# Patient Record
Sex: Male | Born: 1935 | Race: White | Hispanic: No | Marital: Married | Smoking: Former smoker
Health system: Southern US, Community
[De-identification: ages and names within clinical notes are randomized; demographics above are authoritative.]

## PROBLEM LIST (undated history)

## (undated) DIAGNOSIS — K219 Gastro-esophageal reflux disease without esophagitis: Secondary | ICD-10-CM

## (undated) DIAGNOSIS — C4490 Unspecified malignant neoplasm of skin, unspecified: Secondary | ICD-10-CM

## (undated) DIAGNOSIS — R918 Other nonspecific abnormal finding of lung field: Secondary | ICD-10-CM

## (undated) DIAGNOSIS — E039 Hypothyroidism, unspecified: Secondary | ICD-10-CM

## (undated) DIAGNOSIS — C719 Malignant neoplasm of brain, unspecified: Secondary | ICD-10-CM

## (undated) DIAGNOSIS — N400 Enlarged prostate without lower urinary tract symptoms: Secondary | ICD-10-CM

## (undated) DIAGNOSIS — C3490 Malignant neoplasm of unspecified part of unspecified bronchus or lung: Secondary | ICD-10-CM

## (undated) HISTORY — PX: INGUINAL HERNIA REPAIR: SUR1180

## (undated) HISTORY — DX: Benign prostatic hyperplasia without lower urinary tract symptoms: N40.0

## (undated) HISTORY — PX: BRONCHOSCOPY: SUR163

## (undated) HISTORY — DX: Other nonspecific abnormal finding of lung field: R91.8

## (undated) HISTORY — DX: Hypothyroidism, unspecified: E03.9

---

## 1998-03-07 HISTORY — PX: SPINE SURGERY: SHX786

## 1998-06-22 ENCOUNTER — Ambulatory Visit (HOSPITAL_COMMUNITY): Admission: RE | Admit: 1998-06-22 | Discharge: 1998-06-22 | Payer: Self-pay | Admitting: *Deleted

## 1998-06-22 ENCOUNTER — Encounter: Payer: Self-pay | Admitting: *Deleted

## 1998-07-22 ENCOUNTER — Ambulatory Visit (HOSPITAL_COMMUNITY): Admission: RE | Admit: 1998-07-22 | Discharge: 1998-07-23 | Payer: Self-pay | Admitting: Neurosurgery

## 1998-07-22 ENCOUNTER — Encounter: Payer: Self-pay | Admitting: Neurosurgery

## 2012-12-14 LAB — PULMONARY FUNCTION TEST

## 2013-01-07 DIAGNOSIS — R918 Other nonspecific abnormal finding of lung field: Secondary | ICD-10-CM | POA: Insufficient documentation

## 2013-01-08 ENCOUNTER — Ambulatory Visit
Admission: RE | Admit: 2013-01-08 | Discharge: 2013-01-08 | Disposition: A | Discharge status: Home / Self Care | Payer: Medicare Other | Source: Ambulatory Visit | Attending: Thoracic Surgery (Cardiothoracic Vascular Surgery) | Admitting: Thoracic Surgery (Cardiothoracic Vascular Surgery)

## 2013-01-08 ENCOUNTER — Other Ambulatory Visit: Payer: Self-pay

## 2013-01-08 ENCOUNTER — Encounter: Payer: Self-pay | Admitting: Thoracic Surgery (Cardiothoracic Vascular Surgery)

## 2013-01-08 ENCOUNTER — Institutional Professional Consult (permissible substitution) (INDEPENDENT_AMBULATORY_CARE_PROVIDER_SITE_OTHER): Payer: Medicare Other | Admitting: Thoracic Surgery (Cardiothoracic Vascular Surgery)

## 2013-01-08 ENCOUNTER — Other Ambulatory Visit: Payer: Self-pay | Admitting: *Deleted

## 2013-01-08 VITALS — BP 146/60 | HR 91 | Resp 16 | Ht 72.0 in | Wt 175.0 lb

## 2013-01-08 DIAGNOSIS — R918 Other nonspecific abnormal finding of lung field: Secondary | ICD-10-CM

## 2013-01-08 DIAGNOSIS — R911 Solitary pulmonary nodule: Secondary | ICD-10-CM

## 2013-01-08 DIAGNOSIS — D381 Neoplasm of uncertain behavior of trachea, bronchus and lung: Secondary | ICD-10-CM

## 2013-01-08 DIAGNOSIS — R222 Localized swelling, mass and lump, trunk: Secondary | ICD-10-CM

## 2013-01-08 DIAGNOSIS — J984 Other disorders of lung: Secondary | ICD-10-CM

## 2013-01-08 NOTE — Progress Notes (Signed)
PCP is No primary provider on file. Referring Provider is Chodri, Tanvir, MD  Chief Complaint  Patient presents with  . Lung Mass    left lower lobe...eval and treat...has had bronchoscpy with negative bxs, CT CHEST, PET , AND PFT'S  . Lung Lesion    right lung    HPI: 79-year-old man sent for consultation regarding bilateral lung masses.  Darrell Guerrero is an 79-year-old gentleman with a remote history of tobacco abuse. He started smoking at age 7. He smoked up to 2 packs a day before quitting in 1963 at age 35. He was in his usual state of health until about a month ago when he developed a fever. His wife says his blood pressure also was low from time to time, and he generally didn't feel well. He eventually went to his doctor and a chest x-ray showed an abnormality. He was started on antibiotics and a CT of the chest was done. It showed a large mass in the superior segment of the left lower lobe that extended into the hilum with hilar adenopathy. There also was a smaller spiculated nodule in the right upper lobe. A PET CT was done which showed these lesions were hypermetabolic. He was referred to Dr. Chodri and had bronchoscopy with biopsy. Pathology showed reactive cells but no definite tumor.  He denies any unusual headaches or visual changes. He has arthritis which is and standing and has not changed recently. He says that his appetite is normal and he has not lost weight. He gets a little short of breath with heavy exertion. He has not had any chest pain. He does say he has a sensation of just not feeling right in his chest.   Past Medical History  Diagnosis Date  . Hypothyroidism   . BPH (benign prostatic hypertrophy)   . Lung mass     Past Surgical History  Procedure Laterality Date  . Bronchoscopy    . Spine surgery  2000    [facility]    Family History  Problem Relation Age of Onset  . Prostate cancer Father   . Dementia Mother     Social History History  Substance Use Topics   . Smoking status: Former Smoker -- 2.00 packs/day for 28 years    Types: Cigarettes    Quit date: 01/08/1962  . Smokeless tobacco: Never Used     Comment: stopped smoking at age 35  . Alcohol Use: No    Current Outpatient Prescriptions  Medication Sig Dispense Refill  . levothyroxine (SYNTHROID, LEVOTHROID) 25 MCG tablet Take 25 mcg by mouth daily before breakfast.      . tamsulosin (FLOMAX) 0.4 MG CAPS capsule Take 0.4 mg by mouth daily.       No current facility-administered medications for this visit.    No Known Allergies  Review of Systems  Constitutional: Positive for fever. Negative for appetite change, fatigue and unexpected weight change.  HENT: Positive for hearing loss.   Respiratory: Positive for cough and shortness of breath (With heavy exertion). Negative for chest tightness.   Cardiovascular: Negative for chest pain.  Musculoskeletal: Positive for arthralgias and joint swelling.       Leg cramps    BP 146/60  Pulse 91  Resp 16  Ht 6' (1.829 m)  Wt 175 lb (79.379 kg)  BMI 23.73 kg/m2  SpO2 98% Physical Exam  Vitals reviewed. Constitutional: He is oriented to person, place, and time. He appears well-developed and well-nourished. No distress.  Very hard   of hearing  HENT:  Head: Normocephalic and atraumatic.  Eyes: EOM are normal. Pupils are equal, round, and reactive to light.  Neck: Neck supple. No thyromegaly present.  Cardiovascular: Normal rate, regular rhythm, normal heart sounds and intact distal pulses.  Exam reveals no gallop and no friction rub.   No murmur heard. Pulmonary/Chest:  Bronchial BS posteriorly and superiorly on left  Abdominal: Soft. There is no tenderness.  Musculoskeletal: He exhibits no edema.  Lymphadenopathy:    He has no cervical adenopathy.  Neurological: He is alert and oriented to person, place, and time. No cranial nerve deficit.  No focal motor deficit  Skin: Skin is warm and dry.     Diagnostic Tests: CT chest  12/06/2012 Findings Left posterior suprahilar soft tissue mass adjacent to the mediastinum measuring 48 x 40 x 58 mm. This is contiguous with a left suprahilar lymphadenopathy measuring up to 14 mm short axis. The epicenter of the masses in the superior segment of the left lower lobe, extending to the major fissure. This mass does the appear to nearly occluded the superior segmental bronchus.  No other left lung mass. The lower lobe atelectasis.  On the right side there is abnormal density in the right upper lobe posteriorly which most resembles a cluster small nodules but includes a larger mildly spiculated central lesion measuring up to 18 mm. There is a small 3 cm nodule in the mid right lung on image 30. There is a 5 mm nodule in the right lower lobe on image 38. Superimposed and atelectasis.  No pericardial or pleural effusion. No right hilar, paratracheal, or anterior carinal lymphadenopathy. There is borderline small prevascular noted the anterior mediastinum measuring 8 mm. There is a mild surrounding residual thymic tissue. No axillary lymphadenopathy. Negative for major vascular structures of the chest at this time. There is aortic coronary atherosclerosis.  PET/CT 12/19/2012 Impression 1. Mass in the superior segment of left lower lobe is intensely hypermetabolic and concerning for primary lung neoplasm. There is direct extension to the left hilar region.  2. Within the contralateral right upper lobe there is 1.2 cm nodule which exhibits malignant range FDG uptake. This may represent a focus of metastatic disease or synchronous primary.  3. No evidence of distant metastatic disease  Impression: 79-year-old gentleman with a remote history of tobacco abuse who has bilateral lung masses. There is a large mass in the superior segment of the left lower lobe with hilar adenopathy. This is in continuity with the mediastinum and extends into the hilum association with the adenopathy. This almost  certainly represents a new primary bronchogenic carcinoma. An attempt was made to biopsy this bronchoscopically but due to the angulation of the bronchus a diagnosis could not be made. This lesion would at least require a pneumonectomy but may be unresectable due to direct invasion of the mediastinum. I would not do a pneumonectomy in this gentleman given his age and the right upper lobe mass which is suspicious for a second primary. We do need to establish a tissue diagnosis so that appropriate treatment can be given. Both of these lesions should be approachable with electromagnetic navigational bronchoscopy. In addition, we should also be able to sample the hilar nodes and any suspicious mediastinal nodes with interbronchial ultrasound at the same setting.  I recommended to Mr. and [name] that we proceed with electromagnetic navigational bronchoscopy and endobronchial ultrasound under general anesthesia. I described the procedure to them in detail. They understand this is a diagnostic   and therapeutic procedure.. I discussed the outpatient nature of the procedure. They do understand that there is no guarantee that a diagnosis will be made, but this does give us the best chance to sample both lung masses as well as the lymph nodes.  I discussed the indications, risks, benefits, and alternatives. They understand the risk include, but are not limited to, those associated with general anesthesia, bleeding, pneumothorax, failure to make a diagnosis , as well as the possibility of unforeseeable complications. He understands and accepts these risks and wishes to proceed.  Plan: ENB and EBUS on Wednesday, November 12 

## 2013-01-09 ENCOUNTER — Encounter (HOSPITAL_COMMUNITY): Payer: Self-pay | Admitting: Pharmacy Technician

## 2013-01-14 ENCOUNTER — Ambulatory Visit (HOSPITAL_COMMUNITY)
Admission: RE | Admit: 2013-01-14 | Discharge: 2013-01-14 | Disposition: A | Discharge status: Home / Self Care | Payer: Medicare Other | Source: Ambulatory Visit | Attending: Thoracic Surgery (Cardiothoracic Vascular Surgery) | Admitting: Thoracic Surgery (Cardiothoracic Vascular Surgery)

## 2013-01-14 ENCOUNTER — Encounter (HOSPITAL_COMMUNITY): Payer: Self-pay

## 2013-01-14 VITALS — BP 162/87 | HR 69 | Temp 97.7°F | Resp 18 | Ht 72.0 in | Wt 175.6 lb

## 2013-01-14 DIAGNOSIS — Z01812 Encounter for preprocedural laboratory examination: Secondary | ICD-10-CM | POA: Insufficient documentation

## 2013-01-14 DIAGNOSIS — R918 Other nonspecific abnormal finding of lung field: Secondary | ICD-10-CM

## 2013-01-14 DIAGNOSIS — R911 Solitary pulmonary nodule: Secondary | ICD-10-CM

## 2013-01-14 DIAGNOSIS — Z01818 Encounter for other preprocedural examination: Secondary | ICD-10-CM | POA: Insufficient documentation

## 2013-01-14 DIAGNOSIS — Z01810 Encounter for preprocedural cardiovascular examination: Secondary | ICD-10-CM | POA: Insufficient documentation

## 2013-01-14 HISTORY — DX: Unspecified malignant neoplasm of skin, unspecified: C44.90

## 2013-01-14 HISTORY — DX: Gastro-esophageal reflux disease without esophagitis: K21.9

## 2013-01-14 LAB — CBC
MCHC: 34.3 g/dL (ref 30.0–36.0)
MCV: 84.6 fL (ref 78.0–100.0)
Platelets: 285 10*3/uL (ref 150–400)
RDW: 14.7 % (ref 11.5–15.5)
WBC: 8.7 10*3/uL (ref 4.0–10.5)

## 2013-01-14 LAB — COMPREHENSIVE METABOLIC PANEL
ALT: 14 U/L (ref 0–53)
AST: 23 U/L (ref 0–37)
Albumin: 3.9 g/dL (ref 3.5–5.2)
Chloride: 102 mEq/L (ref 96–112)
Creatinine, Ser: 1.15 mg/dL (ref 0.50–1.35)
Glucose, Bld: 94 mg/dL (ref 70–99)
Total Bilirubin: 0.8 mg/dL (ref 0.3–1.2)

## 2013-01-14 LAB — PROTIME-INR: INR: 1.05 (ref 0.00–1.49)

## 2013-01-14 LAB — APTT: aPTT: 29 seconds (ref 24–37)

## 2013-01-14 NOTE — Pre-Procedure Instructions (Signed)
Darrell Guerrero  01/14/2013   Your procedure is scheduled on:  November 12  Report to [facility] Entrance "A" [address] at [employer] AM.  Call this number if you have problems the morning of surgery: [phone]   Remember:   Do not eat food or drink liquids after midnight.   Take these medicines the morning of surgery with A SIP OF WATER: Levothyroxine, Prilosec, Flomax   Do not wear jewelry, make-up or nail polish.  Do not wear lotions, powders, or perfumes. You may wear deodorant.  Do not shave 48 hours prior to surgery. Men may shave face and neck.  Do not bring valuables to the hospital.  [facility] is not responsible                  for any belongings or valuables.               Contacts, dentures or bridgework may not be worn into surgery.  Leave suitcase in the car. After surgery it may be brought to your room.  For patients admitted to the hospital, discharge time is determined by your                treatment team.               Patients discharged the day of surgery will not be allowed to drive  home.  Name and phone number of your driver: Family/ Friend  Special Instructions: Shower using CHG 2 nights before surgery and the night before surgery.  If you shower the day of surgery use CHG.  Use special wash - you have one bottle of CHG for all showers.  You should use approximately 1/3 of the bottle for each shower.   Please read over the following fact sheets that you were given: Pain Booklet, Coughing and Deep Breathing and Surgical Site Infection Prevention

## 2013-01-16 ENCOUNTER — Encounter (HOSPITAL_COMMUNITY): Payer: Self-pay | Admitting: Certified Registered"

## 2013-01-16 ENCOUNTER — Encounter (HOSPITAL_COMMUNITY): Payer: Medicare Other | Admitting: Certified Registered"

## 2013-01-16 ENCOUNTER — Ambulatory Visit (HOSPITAL_COMMUNITY)
Admission: RE | Admit: 2013-01-16 | Discharge: 2013-01-16 | Disposition: A | Discharge status: Home / Self Care | Payer: Medicare Other | Source: Ambulatory Visit | Attending: Thoracic Surgery (Cardiothoracic Vascular Surgery) | Admitting: Thoracic Surgery (Cardiothoracic Vascular Surgery)

## 2013-01-16 ENCOUNTER — Encounter (HOSPITAL_COMMUNITY)
Admission: RE | Disposition: A | Discharge status: Home / Self Care | Payer: Self-pay | Source: Ambulatory Visit | Attending: Thoracic Surgery (Cardiothoracic Vascular Surgery)

## 2013-01-16 ENCOUNTER — Ambulatory Visit (HOSPITAL_COMMUNITY): Payer: Medicare Other

## 2013-01-16 ENCOUNTER — Ambulatory Visit (HOSPITAL_COMMUNITY): Payer: Medicare Other | Admitting: Certified Registered"

## 2013-01-16 DIAGNOSIS — Z87891 Personal history of nicotine dependence: Secondary | ICD-10-CM | POA: Insufficient documentation

## 2013-01-16 DIAGNOSIS — R918 Other nonspecific abnormal finding of lung field: Secondary | ICD-10-CM

## 2013-01-16 DIAGNOSIS — R911 Solitary pulmonary nodule: Secondary | ICD-10-CM

## 2013-01-16 DIAGNOSIS — C3430 Malignant neoplasm of lower lobe, unspecified bronchus or lung: Secondary | ICD-10-CM | POA: Insufficient documentation

## 2013-01-16 DIAGNOSIS — N400 Enlarged prostate without lower urinary tract symptoms: Secondary | ICD-10-CM | POA: Insufficient documentation

## 2013-01-16 DIAGNOSIS — E039 Hypothyroidism, unspecified: Secondary | ICD-10-CM | POA: Insufficient documentation

## 2013-01-16 DIAGNOSIS — D381 Neoplasm of uncertain behavior of trachea, bronchus and lung: Secondary | ICD-10-CM

## 2013-01-16 DIAGNOSIS — K219 Gastro-esophageal reflux disease without esophagitis: Secondary | ICD-10-CM | POA: Insufficient documentation

## 2013-01-16 HISTORY — PX: VIDEO BRONCHOSCOPY WITH ENDOBRONCHIAL NAVIGATION: SHX6175

## 2013-01-16 HISTORY — PX: VIDEO BRONCHOSCOPY WITH ENDOBRONCHIAL ULTRASOUND: SHX6177

## 2013-01-16 SURGERY — VIDEO BRONCHOSCOPY WITH ENDOBRONCHIAL NAVIGATION
Anesthesia: General | Site: Chest | Wound class: Clean Contaminated

## 2013-01-16 MED ORDER — EPINEPHRINE HCL 1 MG/ML IJ SOLN
INTRAMUSCULAR | Status: AC
  Filled 2013-01-16: qty 1

## 2013-01-16 MED ORDER — ACETAMINOPHEN 500 MG PO TABS: 1000.0000 mg | ORAL_TABLET | Freq: Four times a day (QID) | ORAL | Status: DC

## 2013-01-16 MED ORDER — EPHEDRINE SULFATE 50 MG/ML IJ SOLN
INTRAMUSCULAR | Status: DC | PRN
  Administered 2013-01-16: 15 mg via INTRAVENOUS
  Administered 2013-01-16: 10 mg via INTRAVENOUS

## 2013-01-16 MED ORDER — ROCURONIUM BROMIDE 100 MG/10ML IV SOLN
INTRAVENOUS | Status: DC | PRN
  Administered 2013-01-16: 30 mg via INTRAVENOUS

## 2013-01-16 MED ORDER — HYDROMORPHONE HCL PF 1 MG/ML IJ SOLN: 0.2500 mg | INTRAMUSCULAR | Status: DC | PRN

## 2013-01-16 MED ORDER — SODIUM CHLORIDE 0.9 % IV SOLN: 250.0000 mL | INTRAVENOUS | Status: DC | PRN

## 2013-01-16 MED ORDER — NEOSTIGMINE METHYLSULFATE 1 MG/ML IJ SOLN
INTRAMUSCULAR | Status: DC | PRN
  Administered 2013-01-16: 3 mg via INTRAVENOUS

## 2013-01-16 MED ORDER — DEXTROSE 5 % IV SOLN
10.0000 mg | INTRAVENOUS | Status: DC | PRN
  Administered 2013-01-16: 20 ug/min via INTRAVENOUS

## 2013-01-16 MED ORDER — PROPOFOL 10 MG/ML IV BOLUS
INTRAVENOUS | Status: DC | PRN
  Administered 2013-01-16: 200 mg via INTRAVENOUS
  Administered 2013-01-16: 20 mg via INTRAVENOUS
  Administered 2013-01-16: 30 mg via INTRAVENOUS

## 2013-01-16 MED ORDER — LIDOCAINE HCL (CARDIAC) 20 MG/ML IV SOLN
INTRAVENOUS | Status: DC | PRN
  Administered 2013-01-16: 100 mg via INTRAVENOUS
  Administered 2013-01-16: 50 mg via INTRATRACHEAL

## 2013-01-16 MED ORDER — 0.9 % SODIUM CHLORIDE (POUR BTL) OPTIME
TOPICAL | Status: DC | PRN
  Administered 2013-01-16: 1000 mL

## 2013-01-16 MED ORDER — ACETAMINOPHEN 325 MG PO TABS: 650.0000 mg | ORAL_TABLET | ORAL | Status: DC | PRN

## 2013-01-16 MED ORDER — FENTANYL CITRATE 0.05 MG/ML IJ SOLN
INTRAMUSCULAR | Status: DC | PRN
  Administered 2013-01-16: 50 ug via INTRAVENOUS
  Administered 2013-01-16 (×2): 25 ug via INTRAVENOUS
  Administered 2013-01-16: 50 ug via INTRAVENOUS

## 2013-01-16 MED ORDER — LACTATED RINGERS IV SOLN
INTRAVENOUS | Status: DC
  Administered 2013-01-16 (×2): via INTRAVENOUS

## 2013-01-16 MED ORDER — ACETAMINOPHEN 650 MG RE SUPP: 650.0000 mg | RECTAL | Status: DC | PRN

## 2013-01-16 MED ORDER — SODIUM CHLORIDE 0.9 % IJ SOLN: 3.0000 mL | INTRAMUSCULAR | Status: DC | PRN

## 2013-01-16 MED ORDER — GLYCOPYRROLATE 0.2 MG/ML IJ SOLN
INTRAMUSCULAR | Status: DC | PRN
  Administered 2013-01-16: 0.4 mg via INTRAVENOUS

## 2013-01-16 MED ORDER — ONDANSETRON HCL 4 MG/2ML IJ SOLN: 4.0000 mg | Freq: Once | INTRAMUSCULAR | Status: DC | PRN

## 2013-01-16 MED ORDER — SODIUM CHLORIDE 0.9 % IJ SOLN: 3.0000 mL | Freq: Two times a day (BID) | INTRAMUSCULAR | Status: DC

## 2013-01-16 MED ORDER — ONDANSETRON HCL 4 MG/2ML IJ SOLN
INTRAMUSCULAR | Status: DC | PRN
  Administered 2013-01-16: 4 mg via INTRAVENOUS

## 2013-01-16 MED ORDER — EPINEPHRINE HCL 1 MG/ML IJ SOLN
INTRAMUSCULAR | Status: DC | PRN
  Administered 2013-01-16 (×2): 1 mg

## 2013-01-16 MED ORDER — PHENYLEPHRINE HCL 10 MG/ML IJ SOLN
INTRAMUSCULAR | Status: DC | PRN
  Administered 2013-01-16 (×2): 120 ug via INTRAVENOUS

## 2013-01-16 SURGICAL SUPPLY — 44 items
BALL CTTN LRG ABS STRL LF (GAUZE/BANDAGES/DRESSINGS)
BRUSH CYTOL CELLEBRITY 1.5X140 (MISCELLANEOUS) IMPLANT
BRUSH SUPERTRAX BIOPSY (INSTRUMENTS) ×1 IMPLANT
BRUSH SUPERTRAX NDL-TIP CYTO (INSTRUMENTS) ×1 IMPLANT
CANISTER SUCTION 2500CC (MISCELLANEOUS) ×4 IMPLANT
CHANNEL WORK EXTEND EDGE 180 (KITS) IMPLANT
CHANNEL WORK EXTEND EDGE 45 (KITS) IMPLANT
CHANNEL WORK EXTEND EDGE 90 (KITS) IMPLANT
CONT SPEC 4OZ CLIKSEAL STRL BL (MISCELLANEOUS) ×9 IMPLANT
COTTONBALL LRG STERILE PKG (GAUZE/BANDAGES/DRESSINGS) IMPLANT
COVER TABLE BACK 60X90 (DRAPES) ×4 IMPLANT
FILTER STRAW FLUID ASPIR (MISCELLANEOUS) ×2 IMPLANT
FORCEPS BIOP RJ4 1.8 (CUTTING FORCEPS) IMPLANT
FORCEPS BIOP SUPERTRX PREMAR (INSTRUMENTS) ×1 IMPLANT
GLOVE SURG SIGNA 7.5 PF LTX (GLOVE) ×5 IMPLANT
GLOVE SURG SS PI 7.0 STRL IVOR (GLOVE) ×2 IMPLANT
GOWN PREVENTION PLUS XLARGE (GOWN DISPOSABLE) ×4 IMPLANT
KIT PROCEDURE EDGE 180 (KITS) ×1 IMPLANT
KIT PROCEDURE EDGE 45 (KITS) IMPLANT
KIT PROCEDURE EDGE 90 (KITS) IMPLANT
KIT ROOM TURNOVER OR (KITS) ×4 IMPLANT
MARKER SKIN DUAL TIP RULER LAB (MISCELLANEOUS) ×4 IMPLANT
NDL BIOPSY TRANSBRONCH 21G (NEEDLE) IMPLANT
NDL BLUNT 18X1 FOR OR ONLY (NEEDLE) IMPLANT
NDL SUPERTRX PREMARK BIOPSY (NEEDLE) IMPLANT
NEEDLE 22X1 1/2 (OR ONLY) (NEEDLE) IMPLANT
NEEDLE BIOPSY TRANSBRONCH 21G (NEEDLE) IMPLANT
NEEDLE BLUNT 18X1 FOR OR ONLY (NEEDLE) IMPLANT
NEEDLE SUPERTRX PREMARK BIOPSY (NEEDLE) ×2 IMPLANT
NEEDLE SYS SONOTIP II EBUSTBNA (NEEDLE) ×3 IMPLANT
NS IRRIG 1000ML POUR BTL (IV SOLUTION) ×5 IMPLANT
OIL SILICONE PENTAX (PARTS (SERVICE/REPAIRS)) ×3 IMPLANT
PAD ARMBOARD 7.5X6 YLW CONV (MISCELLANEOUS) ×8 IMPLANT
PATCHES PATIENT (LABEL) ×6 IMPLANT
SPONGE GAUZE 4X4 12PLY (GAUZE/BANDAGES/DRESSINGS) ×3 IMPLANT
SYR 20CC LL (SYRINGE) ×4 IMPLANT
SYR 20ML ECCENTRIC (SYRINGE) ×4 IMPLANT
SYR 30ML LL (SYRINGE) ×2 IMPLANT
SYR 5ML LL (SYRINGE) ×4 IMPLANT
SYR 5ML LUER SLIP (SYRINGE) ×2 IMPLANT
SYR CONTROL 10ML LL (SYRINGE) IMPLANT
TOWEL OR 17X24 6PK STRL BLUE (TOWEL DISPOSABLE) ×4 IMPLANT
TRAP SPECIMEN MUCOUS 40CC (MISCELLANEOUS) ×4 IMPLANT
TUBE CONNECTING 12X1/4 (SUCTIONS) ×6 IMPLANT

## 2013-01-16 NOTE — Brief Op Note (Signed)
01/16/2013  3:28 PM  PATIENT:  Darrell Guerrero  79 y.o. male  PRE-OPERATIVE DIAGNOSIS:  RIGHT UPPER LOBE NODULE, LEFT LOWER LOBE LUNG MASS  POST-OPERATIVE DIAGNOSIS:  RIGHT UPPER LOBE NODULE, LEFT LOWER LOBE LUNG MASS  PROCEDURE:  Procedure(s): VIDEO BRONCHOSCOPY WITH ENDOBRONCHIAL NAVIGATION (N/A) VIDEO BRONCHOSCOPY WITH ENDOBRONCHIAL ULTRASOUND (N/A) Biopsies, brushings and bronchoalveolar lavage  SURGEON:  Surgeon(s) and Role:    * Loreli Slot, MD - Primary   ANESTHESIA:   general  EBL:  Total I/O In: 1400 [I.V.:1400] Out: -   BLOOD ADMINISTERED:none  DRAINS: none   LOCAL MEDICATIONS USED:  NONE  SPECIMEN:  Source of Specimen:  Level 7 and 10L lymph nodes, RUL nodule, LLL mass  DISPOSITION OF SPECIMEN:  PATHOLOGY  PLAN OF CARE: Discharge to home after PACU  PATIENT DISPOSITION:  PACU - hemodynamically stable.   Delay start of Pharmacological VTE agent (>24hrs) due to surgical blood loss or risk of bleeding: not applicable

## 2013-01-16 NOTE — Interval H&P Note (Signed)
History and Physical Interval Note:  01/16/2013 11:58 AM  Darrell Guerrero  has presented today for surgery, with the diagnosis of RIGHT UPPER LOBE NODULE, LEFT LOWER LOBE LUNG MASS  The various methods of treatment have been discussed with the patient and family. After consideration of risks, benefits and other options for treatment, the patient has consented to  Procedure(s): VIDEO BRONCHOSCOPY WITH ENDOBRONCHIAL NAVIGATION (N/A) VIDEO BRONCHOSCOPY WITH ENDOBRONCHIAL ULTRASOUND (N/A) as a surgical intervention .  The patient's history has been reviewed, patient examined, no change in status, stable for surgery.  I have reviewed the patient's chart and labs.  Questions were answered to the patient's satisfaction.     Anivea Velasques C

## 2013-01-16 NOTE — H&P (View-Only) (Signed)
PCP is No primary provider on file. Referring Provider is Marcellus Scott, MD  Chief Complaint  Patient presents with  . Lung Mass    left lower lobe...eval and treat...has had bronchoscpy with negative bxs, CT CHEST, PET , AND PFT'S  . Lung Lesion    right lung    HPI: 79 year old man sent for consultation regarding bilateral lung masses.  Mr. Duford is an 79 year old gentleman with a remote history of tobacco abuse. He started smoking at age 45. He smoked up to 2 packs a day before quitting in 1963 at age 78. He was in his usual state of health until about a month ago when he developed a fever. His wife says his blood pressure also was low from time to time, and he generally didn't feel well. He eventually went to his doctor and a chest x-ray showed an abnormality. He was started on antibiotics and a CT of the chest was done. It showed a large mass in the superior segment of the left lower lobe that extended into the hilum with hilar adenopathy. There also was a smaller spiculated nodule in the right upper lobe. A PET CT was done which showed these lesions were hypermetabolic. He was referred to Dr. Blenda Nicely and had bronchoscopy with biopsy. Pathology showed reactive cells but no definite tumor.  He denies any unusual headaches or visual changes. He has arthritis which is and standing and has not changed recently. He says that his appetite is normal and he has not lost weight. He gets a little short of breath with heavy exertion. He has not had any chest pain. He does say he has a sensation of just not feeling right in his chest.   Past Medical History  Diagnosis Date  . Hypothyroidism   . BPH (benign prostatic hypertrophy)   . Lung mass     Past Surgical History  Procedure Laterality Date  . Bronchoscopy    . Spine surgery  2000    DISC    Family History  Problem Relation Age of Onset  . Prostate cancer Father   . Dementia Mother     Social History History  Substance Use Topics   . Smoking status: Former Smoker -- 2.00 packs/day for 28 years    Types: Cigarettes    Quit date: 01/08/1962  . Smokeless tobacco: Never Used     Comment: stopped smoking at age 49  . Alcohol Use: No    Current Outpatient Prescriptions  Medication Sig Dispense Refill  . levothyroxine (SYNTHROID, LEVOTHROID) 25 MCG tablet Take 25 mcg by mouth daily before breakfast.      . tamsulosin (FLOMAX) 0.4 MG CAPS capsule Take 0.4 mg by mouth daily.       No current facility-administered medications for this visit.    No Known Allergies  Review of Systems  Constitutional: Positive for fever. Negative for appetite change, fatigue and unexpected weight change.  HENT: Positive for hearing loss.   Respiratory: Positive for cough and shortness of breath (With heavy exertion). Negative for chest tightness.   Cardiovascular: Negative for chest pain.  Musculoskeletal: Positive for arthralgias and joint swelling.       Leg cramps    BP 146/60  Pulse 91  Resp 16  Ht 6' (1.829 m)  Wt 175 lb (79.379 kg)  BMI 23.73 kg/m2  SpO2 98% Physical Exam  Vitals reviewed. Constitutional: He is oriented to person, place, and time. He appears well-developed and well-nourished. No distress.  Very hard  of hearing  HENT:  Head: Normocephalic and atraumatic.  Eyes: EOM are normal. Pupils are equal, round, and reactive to light.  Neck: Neck supple. No thyromegaly present.  Cardiovascular: Normal rate, regular rhythm, normal heart sounds and intact distal pulses.  Exam reveals no gallop and no friction rub.   No murmur heard. Pulmonary/Chest:  Bronchial BS posteriorly and superiorly on left  Abdominal: Soft. There is no tenderness.  Musculoskeletal: He exhibits no edema.  Lymphadenopathy:    He has no cervical adenopathy.  Neurological: He is alert and oriented to person, place, and time. No cranial nerve deficit.  No focal motor deficit  Skin: Skin is warm and dry.     Diagnostic Tests: CT chest  12/06/2012 Findings Left posterior suprahilar soft tissue mass adjacent to the mediastinum measuring 48 x 40 x 58 mm. This is contiguous with a left suprahilar lymphadenopathy measuring up to 14 mm short axis. The epicenter of the masses in the superior segment of the left lower lobe, extending to the major fissure. This mass does the appear to nearly occluded the superior segmental bronchus.  No other left lung mass. The lower lobe atelectasis.  On the right side there is abnormal density in the right upper lobe posteriorly which most resembles a cluster small nodules but includes a larger mildly spiculated central lesion measuring up to 18 mm. There is a small 3 cm nodule in the mid right lung on image 30. There is a 5 mm nodule in the right lower lobe on image 38. Superimposed and atelectasis.  No pericardial or pleural effusion. No right hilar, paratracheal, or anterior carinal lymphadenopathy. There is borderline small prevascular noted the anterior mediastinum measuring 8 mm. There is a mild surrounding residual thymic tissue. No axillary lymphadenopathy. Negative for major vascular structures of the chest at this time. There is aortic coronary atherosclerosis.  PET/CT 12/19/2012 Impression 1. Mass in the superior segment of left lower lobe is intensely hypermetabolic and concerning for primary lung neoplasm. There is direct extension to the left hilar region.  2. Within the contralateral right upper lobe there is 1.2 cm nodule which exhibits malignant range FDG uptake. This may represent a focus of metastatic disease or synchronous primary.  3. No evidence of distant metastatic disease  Impression: 79 year old gentleman with a remote history of tobacco abuse who has bilateral lung masses. There is a large mass in the superior segment of the left lower lobe with hilar adenopathy. This is in continuity with the mediastinum and extends into the hilum association with the adenopathy. This almost  certainly represents a new primary bronchogenic carcinoma. An attempt was made to biopsy this bronchoscopically but due to the angulation of the bronchus a diagnosis could not be made. This lesion would at least require a pneumonectomy but may be unresectable due to direct invasion of the mediastinum. I would not do a pneumonectomy in this gentleman given his age and the right upper lobe mass which is suspicious for a second primary. We do need to establish a tissue diagnosis so that appropriate treatment can be given. Both of these lesions should be approachable with electromagnetic navigational bronchoscopy. In addition, we should also be able to sample the hilar nodes and any suspicious mediastinal nodes with interbronchial ultrasound at the same setting.  I recommended to Mr. and Mrs. Hallquist that we proceed with electromagnetic navigational bronchoscopy and endobronchial ultrasound under general anesthesia. I described the procedure to them in detail. They understand this is a diagnostic  and therapeutic procedure.. I discussed the outpatient nature of the procedure. They do understand that there is no guarantee that a diagnosis will be made, but this does give [place] the best chance to sample both lung masses as well as the lymph nodes.  I discussed the indications, risks, benefits, and alternatives. They understand the risk include, but are not limited to, those associated with general anesthesia, bleeding, pneumothorax, failure to make a diagnosis , as well as the possibility of unforeseeable complications. He understands and accepts these risks and wishes to proceed.  Plan: ENB and EBUS on Wednesday, November 12

## 2013-01-16 NOTE — Preoperative (Signed)
Beta Blockers   Reason not to administer Beta Blockers:Not Applicable 

## 2013-01-16 NOTE — Anesthesia Preprocedure Evaluation (Signed)
Anesthesia Evaluation  Patient identified by MRN, date of birth, ID band Patient awake    Reviewed: Allergy & Precautions, H&P , NPO status , Patient's Chart, lab work & pertinent test results  Airway       Dental   Pulmonary former smoker,          Cardiovascular     Neuro/Psych    GI/Hepatic GERD-  ,  Endo/Other  Hypothyroidism   Renal/GU      Musculoskeletal   Abdominal   Peds  Hematology   Anesthesia Other Findings Lung nodules x2  Reproductive/Obstetrics                           Anesthesia Physical Anesthesia Plan  ASA: II  Anesthesia Plan: General   Post-op Pain Management:    Induction: Intravenous  Airway Management Planned: Oral ETT  Additional Equipment:   Intra-op Plan:   Post-operative Plan: Extubation in OR  Informed Consent: I have reviewed the patients History and Physical, chart, labs and discussed the procedure including the risks, benefits and alternatives for the proposed anesthesia with the patient or authorized representative who has indicated his/her understanding and acceptance.     Plan Discussed with: CRNA, Anesthesiologist and Surgeon  Anesthesia Plan Comments:         Anesthesia Quick Evaluation

## 2013-01-16 NOTE — Anesthesia Postprocedure Evaluation (Signed)
  Anesthesia Post-op Note  Patient: Darrell Guerrero  Procedure(s) Performed: Procedure(s): VIDEO BRONCHOSCOPY WITH ENDOBRONCHIAL NAVIGATION (N/A) VIDEO BRONCHOSCOPY WITH ENDOBRONCHIAL ULTRASOUND (N/A)  Patient Location: PACU  Anesthesia Type:General  Level of Consciousness: awake, oriented, sedated and patient cooperative  Airway and Oxygen Therapy: Patient Spontanous Breathing  Post-op Pain: none  Post-op Assessment: Post-op Vital signs reviewed, Patient's Cardiovascular Status Stable, Respiratory Function Stable, Patent Airway, No signs of Nausea or vomiting and Pain level controlled  Post-op Vital Signs: stable  Complications: No apparent anesthesia complications

## 2013-01-16 NOTE — Transfer of Care (Signed)
Immediate Anesthesia Transfer of Care Note  Patient: Darrell Guerrero  Procedure(s) Performed: Procedure(s): VIDEO BRONCHOSCOPY WITH ENDOBRONCHIAL NAVIGATION (N/A) VIDEO BRONCHOSCOPY WITH ENDOBRONCHIAL ULTRASOUND (N/A)  Patient Location: PACU  Anesthesia Type:General  Level of Consciousness: awake, alert  and oriented  Airway & Oxygen Therapy: Patient Spontanous Breathing and Patient connected to nasal cannula oxygen  Post-op Assessment: Report given to PACU RN, Post -op Vital signs reviewed and stable and Patient moving all extremities X 4  Post vital signs: Reviewed and stable  Complications: No apparent anesthesia complications

## 2013-01-16 NOTE — Anesthesia Procedure Notes (Addendum)
Procedure Name: Intubation Date/Time: 01/16/2013 12:15 PM Performed by: Jerilee Hoh Pre-anesthesia Checklist: Patient identified, Emergency Drugs available, Suction available and Patient being monitored Patient Re-evaluated:Patient Re-evaluated prior to inductionOxygen Delivery Method: Circle system utilized Preoxygenation: Pre-oxygenation with 100% oxygen Intubation Type: IV induction Ventilation: Mask ventilation without difficulty Laryngoscope Size: Mac and 4 Grade View: Grade II Tube type: Oral Tube size: 8.5 mm Number of attempts: 1 Airway Equipment and Method: Stylet and LTA kit utilized Placement Confirmation: ETT inserted through vocal cords under direct vision,  positive ETCO2 and breath sounds checked- equal and bilateral Secured at: 21 cm Tube secured with: Tape Dental Injury: Teeth and Oropharynx as per pre-operative assessment

## 2013-01-18 NOTE — Op Note (Signed)
NAME:  Darrell Guerrero, Darrell Guerrero                   ACCOUNT NO.:  [id number]  MEDICAL RECORD NO.:  [id number]  LOCATION:  [facility]                         FACILITY:  [facility]  PHYSICIAN:  Salvatore Decent. Dorris Fetch, M.D.DATE OF BIRTH:  [birth year]  DATE OF PROCEDURE:  01/16/2013 DATE OF DISCHARGE:  01/16/2013                              OPERATIVE REPORT   PREOPERATIVE DIAGNOSIS:  Right upper lobe nodule and left lower lobe lung mass.  POSTOPERATIVE DIAGNOSIS:  Right upper lobe nodule and left lower lobe lung mass.  PROCEDURE:  Bronchoscopy, endobronchial ultrasound with aspiration of mediastinal and hilar lymph nodes, electromagnetic navigational bronchoscopy with brushings and biopsies bilaterally, and bronchoalveolar lavage of the left lower lobe.  SURGEON:  Salvatore Decent. Dorris Fetch, MD  ANESTHESIA:  General.  FINDINGS:  Navigated to within 1.5 cm of right upper lobe nodule. Specimen sent for permanent.  Endobronchial ultrasound, nodes identified in 7 and 10 L stations, appeared relatively normal in size.  Quick prep with aspirations revealed lymphocytes, but no tumor.  Unable to visualize endobronchial lesion on the left side.  Biopsies and brushings taken from all the subsegmental bronchi of the superior segmental bronchus of the left lower lobe.  Quick preps and frozen showed no tumor.  There were inflammatory changes as well as benign bronchial epithelial cells.  CLINICAL NOTE:  Darrell Guerrero is an 79 year old gentleman with a history of remote tobacco abuse.  He had presented with a fever a month ago.  He had not been feeling well.  A chest x-ray showed an abnormality.  He was started on antibiotics.  A CT of the chest showed a mass in the superior segment of the left lower lobe, extending into the hilum with some hilar adenopathy.  There was a smaller spiculated nodule in the right upper lobe.  PET-CT showed these lesions were hypermetabolic.  He underwent bronchoscopy with biopsy, but was found to have  reactive cells with no definite tumor.  He was referred for navigational bronchoscopy and endobronchial ultrasound.  The indications, risks, benefits, and alternatives were discussed in detail with the patient.  He understood this is a diagnostic and not a therapeutic procedure.  He accepted the risk of surgery and wished to proceed.  OPERATIVE NOTE:  Darrell Guerrero was brought to the operating room on January 16, 2013, there he was anesthetized and intubated.  Flexible fiberoptic bronchoscopy was performed via the endotracheal tube.  It revealed normal endobronchial anatomy.  There were no endobronchial lesions seen, although there were some abnormalities of the mucosa at the origin of the superior segmental bronchus on the left lower lobe.  These were later biopsied.  The bronchoscope was removed.  The endobronchial ultrasound probe was placed.  There were no apparent paratracheal nodes that were large enough to aspirate.  There was a subcarinal node which was large enough to aspirate and two aspirations were taken from that and sent for quick prep.  Both of these revealed lymphocytes with no tumor.  Further down, a level 10 L node was identified.  It likewise was aspirated, but again the quick prep showed only lymphocytes.  I was unable to get a clear window on a hilar  level 11 node on the left side. The endobronchial ultrasound scope was withdrawn.  The bronchoscope was replaced and the navigational bronchoscopy locatable guide was advanced through the bronchoscope.  The bronchial tree was mapped.  The lesion in the right upper lobe was selected initially and the bronchoscope was advanced to the origin of the right upper lobe bronchus and then the appropriate segmental bronchus was cannulated with the locatable guide which was advanced to within 1.5 cm of the tumor, positioning of the sheath was confirmed with fluoroscopy.  Locatable guide was removed.  Brushings and biopsies were  obtained from the right upper lobe mass.  These were all sent for permanent pathology.  Five sets of brushings were obtained and 6 biopsies were taken.  Next, the left lower lobe mass was selected for navigation. Bronchoscope was navigated to the origin of the left lower lobe bronchus and the locatable guide was advanced into the superior segmental bronchus.  This quickly bifurcated into 2 subsegmental bronchi which then each again bifurcated within a few mm into 2 separate bronchi.  The locatable guide and sheath were advanced to the area of the tumor.  The inner guide was removed.  The sheath was left in place.  Multiple brushings and biopsies were taken.  These were sent for quick prep. While awaiting those results, additional specimens were taken.  Again, all sampling was visualized under fluoroscopy and did appear to be within the vicinity of the tumor mass.  Those specimens returned with no evidence of malignancy.  A separate subsegmental bronchus then was identified and additional samples were taken.  Again, brushings and biopsies were taken, all with fluoroscopic guidance.  These were sent for quick prep as well.  The decision was made thatsince there had been difficulty getting a diagnosis previously and there was no ability to actual visualize the tumor that biopsies and brushings were taken from each of the remaining 2 subsegmental bronchi to ensure that the entire area of the superior segment had been sampled.  Multiple biopsies and multiple brushings were taken from each of these.  A bronchoalveolar lavage was performed.  The third set of specimens subsequently returned with no tumor seen.  The abnormal mucosa at the superior segmental bronchus was biopsied.  This resulted in bleeding.  Dilute epinephrine was applied. There was also bleeding in the subsegmental bronchi.  Bronchoalveolar lavage was performed.  The remainder of the specimens were all sent for permanent  pathology.  After ensuring there was no ongoing active bleeding, the scope was withdrawn.  The patient was extubated in the operating room and taken to the postanesthetic care unit in good condition.     Salvatore Decent Dorris Fetch, M.D.     SCH/MEDQ  D:  01/16/2013  T:  01/17/2013  Job:  [id number]

## 2013-01-21 ENCOUNTER — Encounter (HOSPITAL_COMMUNITY): Payer: Self-pay | Admitting: Thoracic Surgery (Cardiothoracic Vascular Surgery)

## 2013-01-22 ENCOUNTER — Ambulatory Visit (INDEPENDENT_AMBULATORY_CARE_PROVIDER_SITE_OTHER): Payer: Self-pay | Admitting: Thoracic Surgery (Cardiothoracic Vascular Surgery)

## 2013-01-22 ENCOUNTER — Encounter: Payer: Self-pay | Admitting: Thoracic Surgery (Cardiothoracic Vascular Surgery)

## 2013-01-22 VITALS — BP 130/82 | HR 88 | Resp 20 | Ht 72.0 in | Wt 175.0 lb

## 2013-01-22 DIAGNOSIS — Z9889 Other specified postprocedural states: Secondary | ICD-10-CM

## 2013-01-22 DIAGNOSIS — C3490 Malignant neoplasm of unspecified part of unspecified bronchus or lung: Secondary | ICD-10-CM

## 2013-01-22 DIAGNOSIS — R911 Solitary pulmonary nodule: Secondary | ICD-10-CM

## 2013-01-22 DIAGNOSIS — R918 Other nonspecific abnormal finding of lung field: Secondary | ICD-10-CM

## 2013-01-22 DIAGNOSIS — R222 Localized swelling, mass and lump, trunk: Secondary | ICD-10-CM

## 2013-01-22 NOTE — Progress Notes (Signed)
  HPI:  Darrell Guerrero returns to discuss the pathology results from his ENB and EBUS last week.  He is an 79 year old gentleman who recently was found to have bilateral lung masses. He had undergone bronchoscopic biopsy that was nondiagnostic. We did a navigational bronchoscopy and EBUS on him last week. We sent off multiple specimens during the procedure all of which were negative. We sent some additional specimens for permanent pathology only. The very last specimen we sent turned out to be positive for adenocarcinoma.  Darrell Guerrero tolerated the procedure well. His breathing is unchanged. He still has a cough.  Past Medical History  Diagnosis Date  . Hypothyroidism   . BPH (benign prostatic hypertrophy)   . Lung mass   . GERD (gastroesophageal reflux disease)   . Skin cancer       Current Outpatient Prescriptions  Medication Sig Dispense Refill  . levothyroxine (SYNTHROID, LEVOTHROID) 25 MCG tablet Take 25 mcg by mouth every other day.       Marland Kitchen omeprazole (PRILOSEC OTC) 20 MG tablet Take 20 mg by mouth daily.      . tamsulosin (FLOMAX) 0.4 MG CAPS capsule Take 0.4 mg by mouth daily.       No current facility-administered medications for this visit.    Physical Exam BP 130/82  Pulse 88  Resp 20  Ht 6' (1.829 m)  Wt 175 lb (79.379 kg)  BMI 23.73 kg/m2  SpO2 98% Well-appearing 79 year old male in no acute distress Lungs clear with equal breath sounds bilaterally  Diagnostic Tests: Surgical pathology left superior segmental lung mass Adenocarcinoma  Impression: Darrell Guerrero is an 79 year old gentleman who has bilateral lung masses. I discussed the pathology results with Mr. and [name]. The left superior segmental mass is an adenocarcinoma. He will need chemotherapy and radiation therapy. I will discuss this with Dr. Blenda Nicely and have him to choose which oncologist and radiation oncologist he feels would be best to treat Darrell Guerrero.  I don't see a role for surgery at this point but will be  happy to assist with Darrell Guerrero's care in any way that I can.

## 2015-02-03 DIAGNOSIS — R918 Other nonspecific abnormal finding of lung field: Secondary | ICD-10-CM

## 2015-02-03 DIAGNOSIS — Z85118 Personal history of other malignant neoplasm of bronchus and lung: Secondary | ICD-10-CM

## 2015-02-09 ENCOUNTER — Other Ambulatory Visit: Payer: Self-pay

## 2015-02-27 DIAGNOSIS — C3491 Malignant neoplasm of unspecified part of right bronchus or lung: Secondary | ICD-10-CM | POA: Diagnosis not present

## 2015-02-27 DIAGNOSIS — C3492 Malignant neoplasm of unspecified part of left bronchus or lung: Secondary | ICD-10-CM | POA: Diagnosis not present

## 2015-03-27 DIAGNOSIS — C3490 Malignant neoplasm of unspecified part of unspecified bronchus or lung: Secondary | ICD-10-CM

## 2015-03-27 DIAGNOSIS — L299 Pruritus, unspecified: Secondary | ICD-10-CM | POA: Diagnosis not present

## 2015-04-27 DIAGNOSIS — L299 Pruritus, unspecified: Secondary | ICD-10-CM

## 2015-04-27 DIAGNOSIS — C3490 Malignant neoplasm of unspecified part of unspecified bronchus or lung: Secondary | ICD-10-CM | POA: Diagnosis not present

## 2015-05-26 DIAGNOSIS — C3490 Malignant neoplasm of unspecified part of unspecified bronchus or lung: Secondary | ICD-10-CM | POA: Diagnosis not present

## 2015-07-27 DIAGNOSIS — C3490 Malignant neoplasm of unspecified part of unspecified bronchus or lung: Secondary | ICD-10-CM | POA: Diagnosis not present

## 2015-09-29 DIAGNOSIS — C3490 Malignant neoplasm of unspecified part of unspecified bronchus or lung: Secondary | ICD-10-CM | POA: Diagnosis not present

## 2016-02-02 DIAGNOSIS — M899 Disorder of bone, unspecified: Secondary | ICD-10-CM | POA: Diagnosis not present

## 2016-02-02 DIAGNOSIS — C3412 Malignant neoplasm of upper lobe, left bronchus or lung: Secondary | ICD-10-CM | POA: Diagnosis not present

## 2016-02-02 DIAGNOSIS — C771 Secondary and unspecified malignant neoplasm of intrathoracic lymph nodes: Secondary | ICD-10-CM | POA: Diagnosis not present

## 2016-02-09 DIAGNOSIS — C3490 Malignant neoplasm of unspecified part of unspecified bronchus or lung: Secondary | ICD-10-CM | POA: Diagnosis not present

## 2016-04-05 DIAGNOSIS — C3490 Malignant neoplasm of unspecified part of unspecified bronchus or lung: Secondary | ICD-10-CM | POA: Diagnosis not present

## 2016-05-05 DIAGNOSIS — C3492 Malignant neoplasm of unspecified part of left bronchus or lung: Secondary | ICD-10-CM | POA: Diagnosis not present

## 2016-05-05 DIAGNOSIS — G939 Disorder of brain, unspecified: Secondary | ICD-10-CM | POA: Diagnosis not present

## 2016-05-05 DIAGNOSIS — I2699 Other pulmonary embolism without acute cor pulmonale: Secondary | ICD-10-CM

## 2016-05-09 DIAGNOSIS — C7931 Secondary malignant neoplasm of brain: Secondary | ICD-10-CM | POA: Diagnosis not present

## 2016-05-09 DIAGNOSIS — I2699 Other pulmonary embolism without acute cor pulmonale: Secondary | ICD-10-CM | POA: Diagnosis not present

## 2016-05-09 DIAGNOSIS — C3490 Malignant neoplasm of unspecified part of unspecified bronchus or lung: Secondary | ICD-10-CM | POA: Diagnosis not present

## 2016-05-11 ENCOUNTER — Other Ambulatory Visit: Payer: Self-pay | Admitting: *Deleted

## 2016-05-11 DIAGNOSIS — C7931 Secondary malignant neoplasm of brain: Secondary | ICD-10-CM

## 2016-05-11 DIAGNOSIS — C7949 Secondary malignant neoplasm of other parts of nervous system: Principal | ICD-10-CM

## 2016-05-12 ENCOUNTER — Other Ambulatory Visit: Payer: Self-pay | Admitting: Radiation Therapy

## 2016-05-12 DIAGNOSIS — C7931 Secondary malignant neoplasm of brain: Secondary | ICD-10-CM

## 2016-05-12 DIAGNOSIS — C7949 Secondary malignant neoplasm of other parts of nervous system: Principal | ICD-10-CM

## 2016-05-15 ENCOUNTER — Ambulatory Visit
Admission: RE | Admit: 2016-05-15 | Discharge: 2016-05-15 | Disposition: A | Discharge status: Home / Self Care | Payer: Medicare Other | Source: Ambulatory Visit | Attending: Radiation Oncology | Admitting: Radiation Oncology

## 2016-05-15 DIAGNOSIS — C7949 Secondary malignant neoplasm of other parts of nervous system: Principal | ICD-10-CM

## 2016-05-15 DIAGNOSIS — C7931 Secondary malignant neoplasm of brain: Secondary | ICD-10-CM

## 2016-05-15 MED ORDER — GADOBENATE DIMEGLUMINE 529 MG/ML IV SOLN
17.0000 mL | Freq: Once | INTRAVENOUS | Status: AC | PRN
  Administered 2016-05-15: 17 mL via INTRAVENOUS

## 2016-05-16 ENCOUNTER — Encounter: Payer: Self-pay | Admitting: Radiation Oncology

## 2016-05-16 NOTE — Progress Notes (Signed)
Location/Histology of Brain Tumor: metastatic EGFR mutation positive lung adenocarcinoma. Now with 5 hemorrhagic metastases and 2 additional punctate metastases (1 in the medial left temporal lobe and 1 in the medial left parietal lobe).   Patient presented with symptoms of:  Weakness and difficulty walking for 3-4 months  Past or anticipated interventions, if any, per neurosurgery: no  Past or anticipated interventions, if any, per medical oncology: Received carboplatin/Alimta then, reduced dose of Tarceva. Now taking one tablet of Iressa once per day since March 09, 2016.  Dose of Decadron, if applicable: no  Recent neurologic symptoms, if any:   Seizures: no  Headaches: no  Nausea: no  Dizziness/ataxia: yes  Difficulty with hand coordination: yes  Focal numbness/weakness: Denies numbness but, reports generalized weakness.  Visual deficits/changes: Floaters since he began taking Iressa.  Confusion/Memory deficits: no  Painful bone metastases at present, if any: no  SAFETY ISSUES:  Prior radiation? yes  Pacemaker/ICD? no  Possible current pregnancy? no  Is the patient on methotrexate? no  Additional Complaints / other details: 83 year old male. Accompanied today by wife, Tamela Oddi, of 14 years and friend/driver Sports coach. Second marriage for both. Spouse of each passed away. Patient reports he has fallen several times. Patient ambulates with the aid of a cane. [place]. Denies cough, SOB, difficulty swallowing or chest pain. Reports decreased appetite. Reports he sleeps well most nights.

## 2016-05-17 DIAGNOSIS — C3432 Malignant neoplasm of lower lobe, left bronchus or lung: Secondary | ICD-10-CM | POA: Insufficient documentation

## 2016-05-17 DIAGNOSIS — C7931 Secondary malignant neoplasm of brain: Secondary | ICD-10-CM | POA: Insufficient documentation

## 2016-05-18 ENCOUNTER — Ambulatory Visit
Admission: RE | Admit: 2016-05-18 | Discharge: 2016-05-18 | Disposition: A | Discharge status: Home / Self Care | Payer: Medicare Other | Source: Ambulatory Visit | Attending: Radiation Oncology | Admitting: Radiation Oncology

## 2016-05-18 ENCOUNTER — Encounter: Payer: Self-pay | Admitting: Radiation Oncology

## 2016-05-18 DIAGNOSIS — C7931 Secondary malignant neoplasm of brain: Secondary | ICD-10-CM

## 2016-05-18 DIAGNOSIS — Z510 Encounter for antineoplastic radiation therapy: Secondary | ICD-10-CM | POA: Insufficient documentation

## 2016-05-18 DIAGNOSIS — Z818 Family history of other mental and behavioral disorders: Secondary | ICD-10-CM | POA: Diagnosis not present

## 2016-05-18 DIAGNOSIS — Z8042 Family history of malignant neoplasm of prostate: Secondary | ICD-10-CM | POA: Insufficient documentation

## 2016-05-18 DIAGNOSIS — Z9109 Other allergy status, other than to drugs and biological substances: Secondary | ICD-10-CM | POA: Diagnosis not present

## 2016-05-18 DIAGNOSIS — Z881 Allergy status to other antibiotic agents status: Secondary | ICD-10-CM | POA: Diagnosis not present

## 2016-05-18 DIAGNOSIS — C7949 Secondary malignant neoplasm of other parts of nervous system: Principal | ICD-10-CM

## 2016-05-18 DIAGNOSIS — Z87891 Personal history of nicotine dependence: Secondary | ICD-10-CM | POA: Insufficient documentation

## 2016-05-18 DIAGNOSIS — K219 Gastro-esophageal reflux disease without esophagitis: Secondary | ICD-10-CM | POA: Insufficient documentation

## 2016-05-18 DIAGNOSIS — Z85828 Personal history of other malignant neoplasm of skin: Secondary | ICD-10-CM | POA: Diagnosis not present

## 2016-05-18 DIAGNOSIS — C3432 Malignant neoplasm of lower lobe, left bronchus or lung: Secondary | ICD-10-CM | POA: Insufficient documentation

## 2016-05-18 DIAGNOSIS — Z9889 Other specified postprocedural states: Secondary | ICD-10-CM | POA: Diagnosis not present

## 2016-05-18 DIAGNOSIS — N400 Enlarged prostate without lower urinary tract symptoms: Secondary | ICD-10-CM | POA: Diagnosis not present

## 2016-05-18 DIAGNOSIS — E039 Hypothyroidism, unspecified: Secondary | ICD-10-CM | POA: Diagnosis not present

## 2016-05-18 HISTORY — DX: Malignant neoplasm of unspecified part of unspecified bronchus or lung: C34.90

## 2016-05-18 HISTORY — DX: Malignant neoplasm of brain, unspecified: C71.9

## 2016-05-18 MED ORDER — LIDOCAINE-PRILOCAINE 2.5-2.5 % EX CREA: 1.0000 "application " | TOPICAL_CREAM | CUTANEOUS | 0 refills | Status: AC | PRN

## 2016-05-18 MED ORDER — HEPARIN SOD (PORK) LOCK FLUSH 100 UNIT/ML IV SOLN
500.0000 [IU] | Freq: Once | INTRAVENOUS | Status: AC
  Administered 2016-05-18: 500 [IU] via INTRAVENOUS

## 2016-05-18 MED ORDER — SODIUM CHLORIDE 0.9% FLUSH
10.0000 mL | Freq: Once | INTRAVENOUS | Status: AC
  Administered 2016-05-18: 10 mL via INTRAVENOUS

## 2016-05-18 MED ORDER — LIDOCAINE-PRILOCAINE 2.5-2.5 % EX CREA
TOPICAL_CREAM | Freq: Once | CUTANEOUS | Status: AC
  Administered 2016-05-18: 12:00:00 via TOPICAL
  Filled 2016-05-18: qty 5

## 2016-05-18 NOTE — Progress Notes (Signed)
See progress note under physician encounter. 

## 2016-05-18 NOTE — Progress Notes (Signed)
Has armband been applied?  Yes.    Does patient have an allergy to IV contrast dye?: No.   Has patient ever received premedication for IV contrast dye?: No.   Does patient take metformin?: No.  If patient does take metformin when was the last dose: N/A  Date of lab work: 05/05/2016 BUN: 18 CR: 1.0  IV site: subclavian right, condition patent no redness  Has IV site been added to flowsheet?  Yes.

## 2016-05-18 NOTE — Progress Notes (Signed)
De accessed port per protocol. Needle intact upon removal. Patient tolerated well.

## 2016-05-18 NOTE — Progress Notes (Signed)
Radiation Oncology         [phone] ________________________________  Initial Outpatient Consultation  Name: Darrell Guerrero MRN: [id number]  Date: 05/18/2016  DOB: 10/16/1926  PO:EUMPN, Gwyndolyn [place], [place]  Marice Potter, MD   REFERRING PHYSICIAN: Marice Potter, MD  DIAGNOSIS: Diagnoses of Primary adenocarcinoma of lower lobe of left lung ([place]) and Metastasis to brain [facility]) were pertinent to this visit.    ICD-9-CM ICD-10-CM   1. Primary adenocarcinoma of lower lobe of left lung (HCC) 162.5 C34.32   2. Metastasis to brain (HCC) 198.3 C79.31     HISTORY OF PRESENT ILLNESS: Darrell Guerrero is a 83 y.o. male seen at the request of Dr. 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  Mr. Barefoot was initially diagnosed with stage IB (T2aN0M0) lung adenocarcinoma of the LLL in 2014. He was treated with chemoradiation which he completed in February 2015. Follow-up chest CT scan in November 2016 demonstrated new bilateral pulmonary nodules worrisome for disease recurrence.  He underwent repeat CT guided core biopsy of the dominant left upper lobe mass on 02/09/2015. Pathology returned consistent with poorly differentiated adenocarcinoma consistent with lung origin.  The sample was sent for further molecular testing which was EGFR mutation positive and negative for ALK, ROS1, with very weak PD-L1 at only 5%.  He was started on Tarceva 150 mg daily but unfortunately had to be reduced to 100 mg daily due to intolerable dermatologic side effects.  Repeat CT chest on 02/01/2016 demonstrated new pulmonary nodules worrisome for disease recurrence with increased size in a left upper lobe lung mass, progression of pleural metastasis overlying the left lung, new bilateral supraclavicular, mediastinal and upper abdominal metastatic adenopathy and a new sclerotic lesion within the T3 vertebral body worrisome for osseous metastatic  disease as compared with prior CT Chest from 09/28/2015.  Most recent CT Chest with contrast obtained on 05/05/2016 demonstrated a mixed interval response to therapy with interval decrease, and in some areas, resolution of left pleural  nodularity, interval resolution of bilateral clavicular lymphadenopathy with decrease in upper abdominal lymphadenopathy but interval progression of sclerotic lesions in the T3 and T9 vertebral bodies, concerning for progression of bony metastatic disease. The sclerotic lesion of the T9 vertebral body measured 13 mm, increased from 5 mm and a 14 mm sclerotic lesion of the T3 vertebral body had increased from 7 mm on prior scan in November 2017. Also noted was a subsegmental pulmonary embolus in the left lower lobe.   CT head with and without contrast performed on the same date (05/05/2016) demonstrated a 12 mm ring-enhancing lesion within the right lentiform nucleus, likely metastasis. No additional focus of abnormal enhancement was identified. However, the patient went on to have an MRI of the brain with and without contrast on 05/09/2016 which demonstrated 5 supratentorial intraparenchymal metastases with the largest measuring 14 x 14 mm in the right basal ganglia.  An SRS protocol MRI Brain was obtained on 05/15/2016 for treatment planning purposes in preparation for today's visit.  The FINDINGS are as below:  Brain: A peripherally enhancing lesion within the right basal  ganglia measures 14 x 13 x 12 mm.   The lesion anteriorly in the right temporal tip measures 11 x 7 x 6  mm.   A 3 mm lesion is present in the medial left temporal lobe, just  above the tentorium chronic on image 52 of series 10.   A 4  mm lesion is present in the medial left occipital lobe on image  80 of series 10.   A punctate focus of enhancement is present in the medial left  parietal lobe on image 111 of series 10.   A medial posterior left frontal lobe lesion measures 8 mm on  image  126 of series 10.   A lesion within the right superior frontal gyrus measures 4.5 x 5.0  mm on image 136 of series 10.   Each of these lesions demonstrates susceptibility compatible with  prior hemorrhage. No other acute hemorrhage is present. T2 signal is  associated with each of these lesions.   Today the patient presents with his wife and friend. He has c/o weakness and difficulty walking. This has lasted 3-4 months. Pt also endorses dizziness, difficulty with hand eye coordination, and floaters since beginning [place].  Pt denies nausea or vomiting but endorses fatigue and loss of appetite with current chemotherapy regimen. He does endorse intermittent constipation and diarrhea, though mild. Pt endorses difficulty with balance and presents with cane as he has hx of falls. Pt denies tingling or headaches. Pt's wife states pt claims, "sometimes my head doesn't feel right" and this is normally when he is feeling off balance such as when changing positions  PREVIOUS RADIATION THERAPY: Yes  2015 at [place]:  Past Medical History:  Diagnosis Date  . BPH (benign prostatic hypertrophy)   . Brain cancer ([place])    brain mets from lung ca  . GERD (gastroesophageal reflux disease)   . Hypothyroidism   . Lung cancer ([place])   . Lung mass   . Skin cancer       PAST SURGICAL HISTORY: Past Surgical History:  Procedure Laterality Date  . BRONCHOSCOPY    . INGUINAL HERNIA REPAIR Left   . [place]  . VIDEO BRONCHOSCOPY WITH ENDOBRONCHIAL NAVIGATION N/A 01/16/2013   Procedure: VIDEO BRONCHOSCOPY WITH ENDOBRONCHIAL NAVIGATION;  Surgeon: Melrose Nakayama, MD;  Location: [facility];  Service: Thoracic;  Laterality: N/A;  . VIDEO BRONCHOSCOPY WITH ENDOBRONCHIAL ULTRASOUND N/A 01/16/2013   Procedure: VIDEO BRONCHOSCOPY WITH ENDOBRONCHIAL ULTRASOUND;  Surgeon: Melrose Nakayama, MD;  Location: [facility];  Service: Thoracic;  Laterality:  N/A;    FAMILY HISTORY:  Family History  Problem Relation Age of Onset  . Prostate cancer Father   . Dementia Mother     SOCIAL HISTORY:  Social History   Social History  . Marital status: Married    Spouse name: N/A  . Number of children: N/A  . Years of education: N/A   Occupational History  . Not on file.   Social History Main Topics  . Smoking status: Former Smoker    Packs/day: 2.00    Years: 28.00    Types: Cigarettes    Quit date: 01/08/1962  . Smokeless tobacco: Never Used     Comment: stopped smoking at age 11  . Alcohol use No  . Drug use: No  . Sexual activity: No   Other Topics Concern  . Not on file   Social History Narrative  . No narrative on file    ALLERGIES: Levaquin [levofloxacin] and Tape  MEDICATIONS:  Current Outpatient Prescriptions  Medication Sig Dispense Refill  . gefitinib (IRESSA) 250 MG tablet Take 250 mg by mouth daily.    Marland Kitchen levothyroxine (SYNTHROID, LEVOTHROID) 25 MCG tablet Take 25 mcg by mouth every other day.     Marland Kitchen  omeprazole (PRILOSEC OTC) 20 MG tablet Take 20 mg by mouth daily.    . tamsulosin (FLOMAX) 0.4 MG CAPS capsule Take 0.4 mg by mouth daily.    Marland Kitchen lidocaine-prilocaine (EMLA) cream Apply 1 application topically as needed. 30 g 0   No current facility-administered medications for this encounter.     REVIEW OF SYSTEMS:  On review of systems, the patient reports that he is doing well overall. He denies any chest pain, shortness of breath, cough, fevers, chills, night sweats, unintended weight changes. He endorse mild bowel disturbances, but denies bladder disturbances. He denies abdominal pain, nausea or vomiting. Pt denies any new musculoskeletal or joint aches or pains. Pt endorses some loss of appetite with current chemotherapy regimen. A complete review of systems is obtained and is otherwise negative.    PHYSICAL EXAM:  Wt Readings from Last 3 Encounters:  05/18/16 178 lb 3.2 oz (80.8 kg)  01/22/13 175 lb (79.4  kg)  01/14/13 175 lb 9 oz (79.6 kg)   Temp Readings from Last 3 Encounters:  05/18/16 98.3 F (36.8 C) (Oral)  01/16/13 (P) 97.2 F (36.2 C)  01/14/13 97.7 F (36.5 C) (Oral)   BP Readings from Last 3 Encounters:  05/18/16 (!) 157/90  01/22/13 130/82  01/16/13 114/88   Pulse Readings from Last 3 Encounters:  05/18/16 96  01/22/13 88  01/16/13 (P) 77    In general this is a well appearing caucasion male with in no acute distress. He is alert and oriented x4 and appropriate throughout the examination. Skin is intact without any evidence of gross lesions. Cardiovascular exam reveals a regular rate and rhythm, no clicks rubs or murmurs are auscultated. Chest is clear to auscultation bilaterally. Lymphatic assessment is performed and does not reveal any adenopathy in the cervical, supraclavicular, axillary, or inguinal chains. Abdomen has active bowel sounds in all quadrants and is intact. The abdomen is soft, non tender, non distended. Lower extremities are negative for pretibial pitting edema, deep calf tenderness, cyanosis or clubbing. Pt has hx of fracture at left ankle that has been swollen since   KPS = 80  100 - Normal; no complaints; no evidence of disease. 90   - Able to carry on normal activity; minor signs or symptoms of disease. 80   - Normal activity with effort; some signs or symptoms of disease. 53   - Cares for self; unable to carry on normal activity or to do active work. 60   - Requires occasional assistance, but is able to care for most of his personal needs. 50   - Requires considerable assistance and frequent medical care. 51   - Disabled; requires special care and assistance. 43   - Severely disabled; hospital admission is indicated although death not imminent. 39   - Very sick; hospital admission necessary; active supportive treatment necessary. 10   - Moribund; fatal processes progressing rapidly. 0     - Dead  Karnofsky DA, [facility], Craver LS and  [facility] [phone]) The use of the nitrogen mustards in the palliative treatment of carcinoma: with particular reference to bronchogenic carcinoma Cancer 1 634-56  LABORATORY DATA:  Lab Results  Component Value Date   WBC 8.7 01/14/2013   HGB 14.3 01/14/2013   HCT 41.7 01/14/2013   MCV 84.6 01/14/2013   PLT 285 01/14/2013   Lab Results  Component Value Date   NA 137 01/14/2013   K 4.5 01/14/2013   CL 102 01/14/2013   CO2 24 01/14/2013  Lab Results  Component Value Date   ALT 14 01/14/2013   AST 23 01/14/2013   ALKPHOS 87 01/14/2013   BILITOT 0.8 01/14/2013     RADIOGRAPHY: Mr Jeri Cos ZD Contrast  Result Date: 05/15/2016 CLINICAL DATA:  Lung cancer. Secondary malignant neoplasm of the brain. Weakness and difficulty walking for 3-4 months. EXAM: MRI HEAD WITHOUT AND WITH CONTRAST TECHNIQUE: Multiplanar, multiecho pulse sequences of the brain and surrounding structures were obtained without and with intravenous contrast. CONTRAST:  53m MULTIHANCE GADOBENATE DIMEGLUMINE 529 MG/ML IV SOLN COMPARISON:  [facility] FINDINGS: Brain: A peripherally enhancing lesion within the right basal ganglia measures 14 x 13 x 12 mm. The lesion anteriorly in the right temporal tip measures 11 x 7 x 6 mm. A 3 mm lesion is present in the medial left temporal lobe, just above the tentorium chronic on image 52 of series 10. A 4 mm lesion is present in the medial left occipital lobe on image 80 of series 10. A punctate focus of enhancement is present in the medial left parietal lobe on image 111 of series 10. A medial posterior left frontal lobe lesion measures 8 mm on image 126 of series 10. A lesion within the right superior frontal gyrus measures 4.5 x 5.0 mm on image 136 of series 10. Each of these lesions demonstrates susceptibility compatible with prior hemorrhage. No other acute hemorrhage is present. T2 signal is associated with each of these lesions. Moderate generalized  atrophy and white matter changes are present in addition. The internal auditory canals are normal. Mild white matter changes are present in the brainstem. A remote medial right PICA territory infarct is evident. Vascular: Flow is present in the major intracranial arteries. Skull and upper cervical spine: The skullbase is within normal limits. Midline sagittal structures are unremarkable. Sinuses/Orbits: The paranasal sinuses and mastoid air cells are clear. A left lens replacement is present. The globes and orbits are otherwise unremarkable. IMPRESSION: 1. The 5 hemorrhagic metastases seen on the prior study are again noted. 2. 2 additional punctate metastases are identified, 1 in the medial left temporal lobe and 1 in the medial left parietal lobe, as described. 3. Moderate diffuse periventricular and subcortical white matter and atrophy is present bilaterally. This likely reflects the sequela of chronic microvascular ischemia. Electronically Signed   By: CSan MorelleM.D.   On: 05/15/2016 18:00      IMPRESSION/PLAN: 1. 83y.o. 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 Today, I talked to the patient and family about the findings and work-up thus far.  At this point, the patient would potentially benefit from radiotherapy. The options include whole brain irradiation versus stereotactic radiosurgery. There are pros and cons associated with each of these potential treatment options. Whole brain radiotherapy would treat the known metastatic deposits and help provide some reduction of risk for future brain metastases. However, whole brain radiotherapy carries potential risks including hair loss, subacute somnolence, and neurocognitive changes including a possible reduction in short-term memory. Whole brain radiotherapy also may carry a lower likelihood of tumor control at the treatment sites because of the low-dose used. Stereotactic radiosurgery carries a  higher likelihood for local tumor control at the targeted sites with lower associated risk for neurocognitive changes such as memory loss. However, the use of stereotactic radiosurgery in this setting may leave the patient at increased risk for new brain metastases elsewhere in the brain as high as 50-60%. Accordingly,  patients who receive stereotactic radiosurgery in this setting should undergo ongoing surveillance imaging with brain MRI more frequently in order to identify and treat new small brain metastases before they become symptomatic. Stereotactic radiosurgery does carry some different risks, including a risk of radionecrosis.  We discussed the dilemma regarding whole brain radiotherapy versus stereotactic radiosurgery. We discussed the pros and cons of each. We also discussed the logistics and delivery of each. We reviewed the results associated with each of the treatments described above. The patient seems to understand the treatment options and would like to proceed with stereotactic radiosurgery. The patient would like to proceed with radiation and will be scheduled for CT simulation.  Discussed consent form authorizing palliative radiation treatment and obtained signature.   ------------------------------------------------   Tyler Pita, MD [place] Director and Director of Stereotactic Radiosurgery Direct Dial: [phone]  Fax: [phone] [email or web address]  Skype  [email or web address]  This document serves as a record of services personally performed by Tyler Pita, MD and Freeman Caldron, PA-C. It was created on their behalf by Maryla Morrow, a trained medical scribe. The creation of this record is based on the scribe's personal observations and the provider's statements to them. This document has been checked and approved by the attending provider.

## 2016-05-25 NOTE — Progress Notes (Signed)
  Radiation Oncology         [phone] ________________________________  Name: Darrell Guerrero MRN: [id number]  Date: 05/18/2016  DOB: [birth year]  SIMULATION AND TREATMENT PLANNING NOTE    ICD-9-CM ICD-10-CM   1. Metastasis to brain ([place]) 198.3 C79.31     DIAGNOSIS:  83 y.o. 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  Identity was confirmed.  All relevant records and images related to the planned course of therapy were reviewed.  The patient freely provided informed written consent to proceed with treatment after reviewing the details related to the planned course of therapy. The consent form was witnessed and verified by the simulation staff. Intravenous access was established for contrast administration. Then, the patient was set-up in a stable reproducible supine position for radiation therapy.  A relocatable thermoplastic stereotactic head frame was fabricated for precise immobilization.  CT images were obtained.  Surface markings were placed.  The CT images were loaded into the planning software and fused with the patient's targeting MRI scan.  Then the target and avoidance structures were contoured.  Treatment planning then occurred.  The radiation prescription was entered and confirmed.  I have requested 3D planning  I have requested a DVH of the following structures: Brain stem, brain, left eye, right eye, lenses, optic chiasm, target volumes, uninvolved brain, and normal tissue.    SPECIAL TREATMENT PROCEDURE:  The planned course of therapy using radiation constitutes a special treatment procedure. Special care is required in the management of this patient for the following reasons. This treatment constitutes a Special Treatment Procedure for the following reason: High dose per fraction requiring special monitoring for increased toxicities of treatment including daily  imaging.  The special nature of the planned course of radiotherapy will require increased physician supervision and oversight to ensure patient's safety with optimal treatment outcomes.  PLAN:  The patient will receive 20 Gy in 1 fraction to the following 7 targets:  Rt Basal Ganglia 14 mm Rt Temporal 11 mm. Lt Temporal 3 mm Lt Occipital 4 mm Lt Parietal 2 mm Lt Frontal 8 mm  Rt Frontal 5 mm  ________________________________  Sheral Apley. Tammi Klippel, M.D.

## 2016-05-30 ENCOUNTER — Ambulatory Visit: Payer: Medicare Other | Admitting: Radiation Oncology

## 2016-05-30 DIAGNOSIS — Z510 Encounter for antineoplastic radiation therapy: Secondary | ICD-10-CM | POA: Diagnosis not present

## 2016-05-31 ENCOUNTER — Encounter: Payer: Self-pay | Admitting: *Deleted

## 2016-05-31 NOTE — Progress Notes (Signed)
[facility] Psychosocial Distress Screening Clinical Social Work  Clinical Social Work was referred by distress screening protocol.  The patient scored a 5 on the Psychosocial Distress Thermometer which indicates moderate distress.  Patients distress was associated with physical concerns.  Concerns were noted and addressed by medical staff.  No social work needs indicated at this time.      ONCBCN DISTRESS SCREENING 05/18/2016  Screening Type Initial Screening  Distress experienced in past week (1-10) 5  Physical Problem type Loss of appetitie;Getting around;Constipation/diarrhea;Skin dry/itchy  Physician notified of physical symptoms Yes  Referral to clinical psychology No  Referral to clinical social work No  Referral to dietition No  Referral to financial advocate No  Referral to support programs No  Referral to palliative care No     Johnnye Lana, MSW, LCSW, OSW-C Clinical Social Worker [place] [phone]

## 2016-06-02 ENCOUNTER — Encounter: Payer: Self-pay | Admitting: Radiation Oncology

## 2016-06-02 ENCOUNTER — Ambulatory Visit
Admission: RE | Admit: 2016-06-02 | Discharge: 2016-06-02 | Disposition: A | Discharge status: Home / Self Care | Payer: Medicare Other | Source: Ambulatory Visit | Attending: Radiation Oncology | Admitting: Radiation Oncology

## 2016-06-02 VITALS — BP 162/87 | HR 95 | Temp 97.6°F | Resp 20

## 2016-06-02 DIAGNOSIS — C7931 Secondary malignant neoplasm of brain: Secondary | ICD-10-CM

## 2016-06-02 DIAGNOSIS — Z510 Encounter for antineoplastic radiation therapy: Secondary | ICD-10-CM | POA: Diagnosis not present

## 2016-06-02 NOTE — Progress Notes (Signed)
  Radiation Oncology         [phone]) [phone] ________________________________  Stereotactic Treatment Procedure Note  Name: Darrell Guerrero MRN: [id number]  Date: 06/02/2016  DOB: November 08, 1926  SPECIAL TREATMENT PROCEDURE    ICD-9-CM ICD-10-CM   1. Metastasis to brain ([place]) 198.3 C79.31     3D TREATMENT PLANNING AND DOSIMETRY:  The patient's radiation plan was reviewed and approved by neurosurgery and radiation oncology prior to treatment.  It showed 3-dimensional radiation distributions overlaid onto the planning CT/MRI image set.  The [facility] for the target structures as well as the organs at risk were reviewed. The documentation of the 3D plan and dosimetry are filed in the radiation oncology EMR.  NARRATIVE:  Darrell Guerrero was brought to the TrueBeam stereotactic radiation treatment machine and placed supine on the CT couch. The head frame was applied, and the patient was set up for stereotactic radiosurgery.  Neurosurgery was present for the set-up and delivery  SIMULATION VERIFICATION:  In the couch zero-angle position, the patient underwent Exactrac imaging using the Brainlab system with orthogonal KV images.  These were carefully aligned and repeated to confirm treatment position for each of the isocenters.  The Exactrac snap film verification was repeated at each couch angle.  PROCEDURE: Darrell Guerrero received stereotactic radiosurgery to the following targets: 7 targets were treated with a single isocenter using 4 Dynamic Conformal Arcs to a prescription dose of 20 Gy.  ExacTrac registration was performed for each couch angle.  The 100% isodose line was prescribed.  6 MV X-rays were delivered in the flattening filter free beam mode.  STEREOTACTIC TREATMENT MANAGEMENT:  Following delivery, the patient was transported to nursing in stable condition and monitored for possible acute effects.  Vital signs were recorded BP (!) 162/87 (BP Location: Left Arm, Patient Position: Sitting, Cuff Size: Normal)    Pulse 95   Temp 97.6 F (36.4 C) (Oral)   Resp 20   SpO2 98% . The patient tolerated treatment without significant acute effects, and was discharged to home in stable condition.    PLAN: Follow-up in one month.  ________________________________  Sheral Apley. Tammi Klippel, M.D.

## 2016-06-02 NOTE — Op Note (Signed)
  Name: Darrell Guerrero  MRN: [id number]  Date: 06/02/2016   DOB: 1926-03-12  Stereotactic Radiosurgery Operative Note  PRE-OPERATIVE DIAGNOSIS:  Multiple Brain Metastases  POST-OPERATIVE DIAGNOSIS:  Multiple Brain Metastases  PROCEDURE:  Stereotactic Radiosurgery  SURGEON:  Peggyann Shoals, MD  NARRATIVE: The patient underwent a radiation treatment planning session in the radiation oncology simulation suite under the care of the radiation oncology physician and physicist.  I participated closely in the radiation treatment planning afterwards. The patient underwent planning CT which was fused to 3T high resolution MRI with 1 mm axial slices.  These images were fused on the planning system.  We contoured the gross target volumes and subsequently expanded this to yield the Planning Target Volume. I actively participated in the planning process.  I helped to define and review the target contours and also the contours of the optic pathway, eyes, brainstem and selected nearby organs at risk.  All the dose constraints for critical structures were reviewed and compared to AAPM Task Group 101.  The prescription dose conformity was reviewed.  I approved the plan electronically.    Accordingly, Loranzo C Yanik was brought to the TrueBeam stereotactic radiation treatment linac and placed in the custom immobilization mask.  The patient was aligned according to the IR fiducial markers with BrainLab Exactrac, then orthogonal x-rays were used in ExacTrac with the 6DOF robotic table and the shifts were made to align the patient  Haliimaile received stereotactic radiosurgery uneventfully.    Lesions treated:  7   Complex lesions treated:  0 (>3.5 cm, <34m of optic path, or within the brainstem)   The detailed description of the procedure is recorded in the radiation oncology procedure note.  I was present for the duration of the procedure.  DISPOSITION:  Following delivery, the patient was transported to nursing in  stable condition and monitored for possible acute effects to be discharged to home in stable condition with follow-up in one month.  SPeggyann Shoals MD 06/02/2016 5:04 PM

## 2016-06-02 NOTE — Progress Notes (Addendum)
Monitor 15 minutes, no head ache, no blurry vision, ambulates with a cane, family in with patient, no driving, call for any symptoms profuse vomiting, fever > 100.5, varbal understanding, d/c home

## 2016-06-14 DIAGNOSIS — C3490 Malignant neoplasm of unspecified part of unspecified bronchus or lung: Secondary | ICD-10-CM | POA: Diagnosis not present

## 2016-06-14 DIAGNOSIS — C7931 Secondary malignant neoplasm of brain: Secondary | ICD-10-CM | POA: Diagnosis not present

## 2016-06-15 NOTE — Progress Notes (Signed)
  Radiation Oncology         ([phone]) [phone] ________________________________  Name: Darrell Guerrero MRN: [id number]  Date: 06/02/2016  DOB: 11-05-1926  End of Treatment Note  Diagnosis:   83 y.o. 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  ExacTrac registration was performed for each couch angle.  The 100% isodose line was prescribed.  6 MV X-rays were delivered in the flattening filter free beam mode.  Narrative: The patient tolerated stereotactic radiosurgery relatively well.   The patient did not complain of headache, dizziness, or fever with treatment.   Plan: The patient has completed radiation treatment. The patient will return to radiation oncology clinic for routine followup in one month. I advised him to call or return sooner if he has any questions or concerns related to his recovery or treatment. ________________________________  Sheral Apley. Tammi Klippel, M.D.   This document serves as a record of services personally performed by Tyler Pita, MD. It was created on his behalf by Arlyce Harman, a trained medical scribe. The creation of this record is based on the scribe's personal observations and the provider's statements to them. This document has been checked and approved by the attending provider.

## 2016-06-29 NOTE — Progress Notes (Signed)
Darrell Guerrero 83 y.o. 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   Headache:None Pain:No Dizziness:When he gets up from sitting,encouraged to wait a few minutes before getting up to make sure his balance is stable. Nausea/Vomiting/Ataxia:No Visional changes(Blurred/Diplopia (double vision), blind spots and peripheral vision changes): Ring in ears:No,wears hearing aids Fatigue:Reports fatigue all during the day. Cognitive changes:Pt alert & oriented x 3 with fluent speech, no short or long term memory recall issues noticed. Weight: Wt Readings from Last 3 Encounters:  06/30/16 168 lb 6.4 oz (76.4 kg)  05/18/16 178 lb 3.2 oz (80.8 kg)  01/22/13 175 lb (79.4 kg)   Appetite:Appetite is fair has a 10 pound weight loss since March was taking Iressa wife did not feel it was helping his appetite. Respiratory complaints:None Imaging:N/A Lab:N/A BP (!) 148/88   Pulse 98   Temp 97.6 F (36.4 C) (Oral)   Resp 18   Ht (!) 6" (0.152 m)   Wt 168 lb 6.4 oz (76.4 kg)   SpO2 98%   BMI 3288.84 kg/m Right ar. Sitting BP 124/78   Pulse 96   Temp 97.6 F (36.4 C) (Oral)   Resp 18   Ht (!) 6" (0.152 m)   Wt 168 lb 6.4 oz (76.4 kg)   SpO2 98%   BMI 3288.84 kg/m  Right arm standing due to weight loss    

## 2016-06-29 NOTE — Progress Notes (Signed)
Radiation Oncology         [phone]) [phone] ________________________________  Name: Darrell Guerrero MRN: [id number]  Date: 06/30/2016  DOB: 05/22/1926  Post Treatment Note  CC: Dortha Kern, PA  Marice Potter, MD  Diagnosis:   83 y.o. 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  He tolerated radiotherapy well.    The patient did not complain of headache, dizziness, or fever with treatment.                          On review of systems, the patient states that he continues will mild imbalance which has remained unchanged.  He denies headaches, tinnitus, diplopia, dizziness, or difficulty with word finding.  No recent change in memory or cognition.  He has noticed a decline in his visual acuity since beginning treatment but this is unchanged recently.  He has noted a 10lb weight loss since 05/2016 but attributes this to decreased appetite while taking Hansel Feinstein.  He has recently discontinued this medication under the direction of Dr. Bobby Rumpf and has already noticed a significant improvement in his appetite and is gaining weight. He is planning to begin a new systemic therapy next week under the care of Dr. Bobby Rumpf.  ALLERGIES:  is allergic to levaquin [levofloxacin] and tape.  Meds: Current Outpatient Prescriptions  Medication Sig Dispense Refill  . levothyroxine (SYNTHROID, LEVOTHROID) 25 MCG tablet Take 25 mcg by mouth every other day.     . lidocaine-prilocaine (EMLA) cream Apply 1 application topically as needed. 30 g 0  . omeprazole (PRILOSEC OTC) 20 MG tablet Take 20 mg by mouth daily.    . tamsulosin (FLOMAX)  0.4 MG CAPS capsule Take 0.4 mg by mouth daily.    Marland Kitchen gefitinib (IRESSA) 250 MG tablet Take 250 mg by mouth daily.     No current facility-administered medications for this encounter.     Physical Findings:  height is 6" (0.152 m) (abnormal) and weight is 168 lb 6.4 oz (76.4 kg). His oral temperature is 97.6 F (36.4 C). His blood pressure is 124/78 and his pulse is 96. His respiration is 18 and oxygen saturation is 98%.  Pain Assessment Pain Score: 0-No pain/10 In general this is a well appearing caucasian male in no acute distress. He's alert and oriented x4 with fluent speech and appropriate throughout the examination. Cardiopulmonary assessment is negative for acute distress and he exhibits normal effort.   Lab Findings: Lab Results  Component Value Date   WBC 8.7 01/14/2013   HGB 14.3 01/14/2013   HCT 41.7 01/14/2013   MCV 84.6 01/14/2013   PLT 285 01/14/2013     Radiographic Findings: No results found.  Impression/Plan: 1. 83 y.o. 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  He has recovered well form the effects of radiotherapy.  We will plan to re-image with 3T MRI brain in 2 months with follow up in the office to review  the results.  He will continue close follow up and evaluation with Dr. Lavera Guise at [facility] for continued systemic therapy and management of his disease.  He knows to call the office with any questions or concerns in the interim.    Nicholos Johns, PA-C

## 2016-06-30 ENCOUNTER — Ambulatory Visit
Admission: RE | Admit: 2016-06-30 | Discharge: 2016-06-30 | Disposition: A | Discharge status: Home / Self Care | Payer: Medicare Other | Source: Ambulatory Visit | Attending: Urology | Admitting: Urology

## 2016-06-30 ENCOUNTER — Encounter: Payer: Self-pay | Admitting: Urology

## 2016-06-30 VITALS — BP 124/78 | HR 96 | Temp 97.6°F | Resp 18 | Ht <= 58 in | Wt 168.4 lb

## 2016-06-30 DIAGNOSIS — C3432 Malignant neoplasm of lower lobe, left bronchus or lung: Secondary | ICD-10-CM

## 2016-06-30 DIAGNOSIS — Z510 Encounter for antineoplastic radiation therapy: Secondary | ICD-10-CM | POA: Diagnosis not present

## 2016-06-30 DIAGNOSIS — C7931 Secondary malignant neoplasm of brain: Secondary | ICD-10-CM

## 2016-06-30 NOTE — Addendum Note (Signed)
Encounter addended by: Malena Edman, RN on: 06/30/2016  1:22 PM<BR>    Actions taken: Charge Capture section accepted

## 2016-07-14 DIAGNOSIS — C3490 Malignant neoplasm of unspecified part of unspecified bronchus or lung: Secondary | ICD-10-CM | POA: Diagnosis not present

## 2016-07-14 DIAGNOSIS — H539 Unspecified visual disturbance: Secondary | ICD-10-CM | POA: Diagnosis not present

## 2016-07-14 DIAGNOSIS — C7931 Secondary malignant neoplasm of brain: Secondary | ICD-10-CM | POA: Diagnosis not present

## 2016-08-16 ENCOUNTER — Other Ambulatory Visit: Payer: Self-pay | Admitting: *Deleted

## 2016-08-16 DIAGNOSIS — C7949 Secondary malignant neoplasm of other parts of nervous system: Principal | ICD-10-CM

## 2016-08-16 DIAGNOSIS — C7931 Secondary malignant neoplasm of brain: Secondary | ICD-10-CM

## 2016-08-22 ENCOUNTER — Encounter: Payer: Self-pay | Admitting: Radiation Therapy

## 2016-08-22 NOTE — Progress Notes (Signed)
Pt is now under Hospice Care. MRI and Follow-up appointments cancelled. No future Rad Onc appointments scheduled.   Mont Dutton R.T.(R)(T) Special Procedures Navigator

## 2016-09-05 ENCOUNTER — Inpatient Hospital Stay: Admission: RE | Admit: 2016-09-05 | Payer: Self-pay | Source: Ambulatory Visit | Admitting: Radiation Oncology

## 2017-04-07 DEATH — deceased

## 2018-10-25 IMAGING — MR MR HEAD WO/W CM
11 series · 48 of 48 positions shown · IV contrast (multihance)
Comparison: MRI brain 05/09/2016 at [facility].

CLINICAL DATA: Lung cancer. Secondary malignant neoplasm of the
brain. Weakness and difficulty walking for 3-4 months.

EXAM:
MRI HEAD WITHOUT AND WITH CONTRAST
TECHNIQUE: Multiplanar, multiecho pulse sequences of the brain and surrounding
structures were obtained without and with intravenous contrast.
CONTRAST:  17mL MULTIHANCE GADOBENATE DIMEGLUMINE 529 MG/ML IV SOLN

[Series 2: FLAIR · sagittal · 3.0mm · 0.75mm/px · 2 of 39 slices shown (1 of 2)]
[im 1/39]
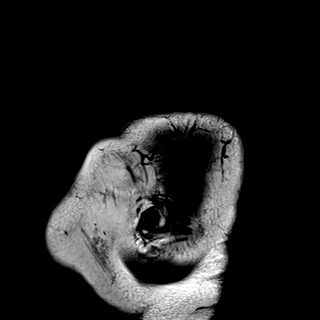
[im 39/39]
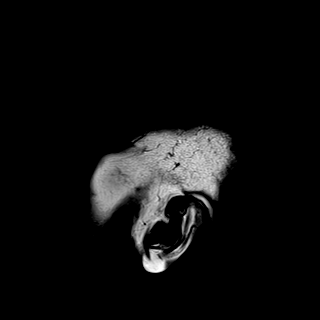

[Series 3: DWI · axial · 3.0mm · 1.50mm/px · z∈[-108,+52]mm · 5 of 84 slices shown (1 of 2)]
[im 1/84]
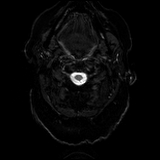
[im 21/84]
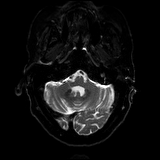
[im 42/84]
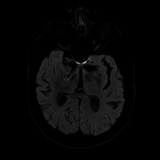
[im 63/84]
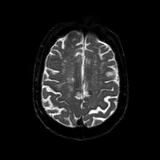
[im 84/84]
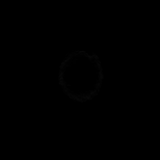

[Series 4: DWI · axial · 3.0mm · 1.50mm/px · z∈[-108,+52]mm · 3 of 42 slices shown (2 of 2)]
[im 1/42]
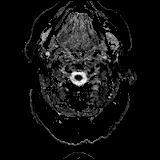
[im 21/42]
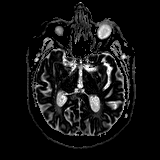
[im 42/42]
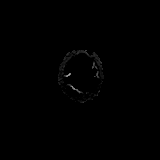

[Series 5: T2 · axial · 5.0mm · 0.57mm/px · z∈[-108,+54]mm · 2 of 28 slices shown]
[im 1/28]
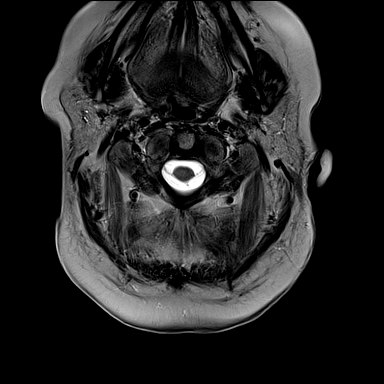
[im 28/28]
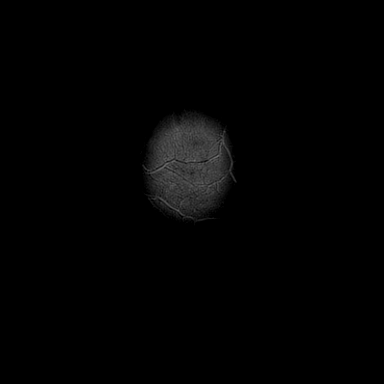

[Series 6: GRE · axial · 5.0mm · 0.57mm/px · z∈[-108,+54]mm · 2 of 28 slices shown]
[im 1/28]
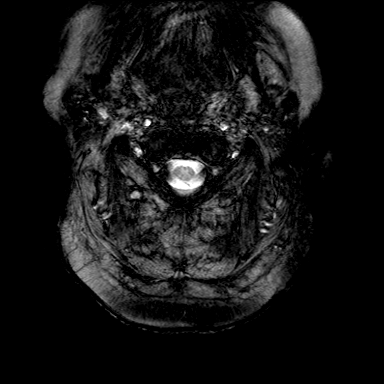
[im 28/28]
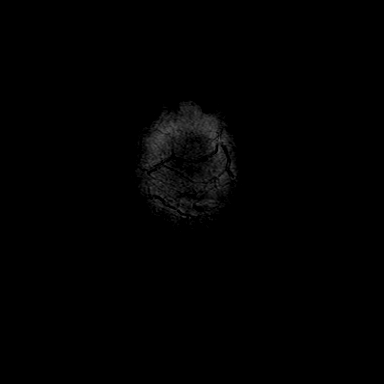

[Series 7: FLAIR · axial · 3.0mm · 0.57mm/px · z∈[-105,+48]mm · 3 of 52 slices shown (2 of 2)]
[im 1/52]
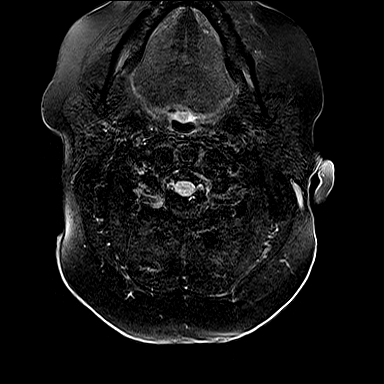
[im 26/52]
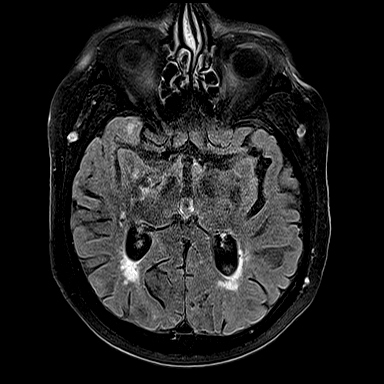
[im 52/52]
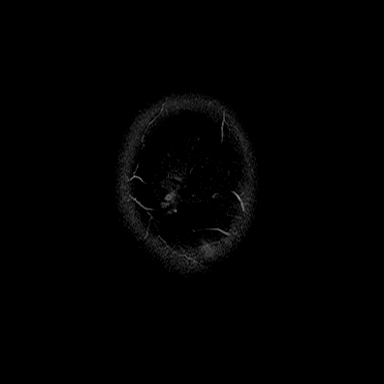

[Series 8: T1 · axial · 1.0mm · 0.75mm/px · z∈[-108,+51]mm · 11 of 160 slices shown]
[im 1/160]
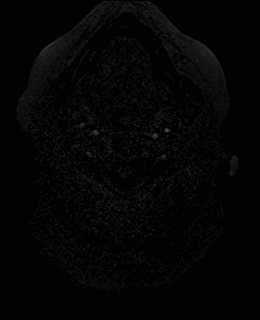
[im 16/160]
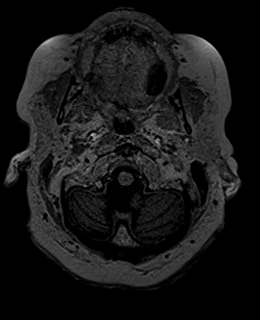
[im 32/160]
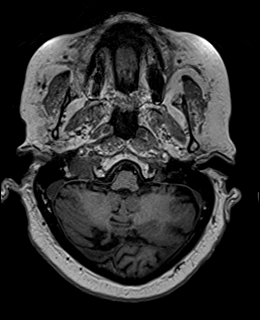
[im 48/160]
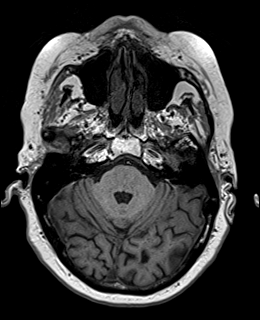
[im 64/160]
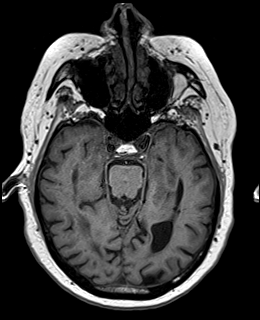
[im 80/160]
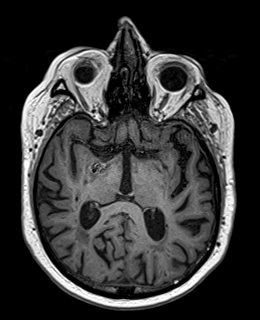
[im 96/160]
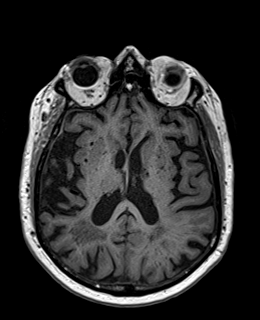
[im 112/160]
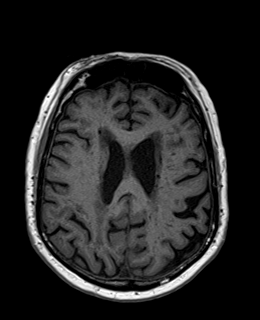
[im 128/160]
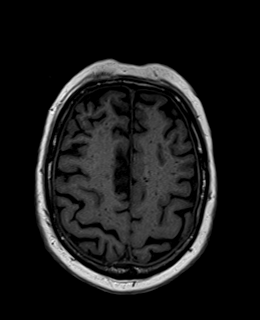
[im 144/160]
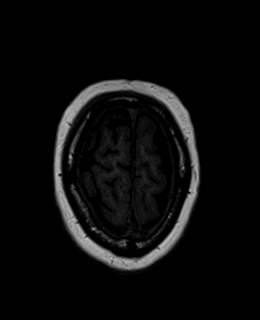
[im 160/160]
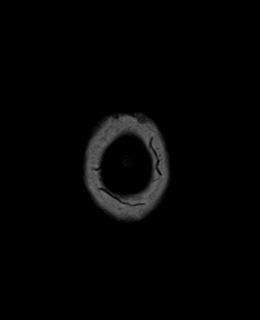

[Series 9: T2 post-contrast · coronal · 3.0mm · 0.57mm/px · 3 of 47 slices shown]
[im 1/47]
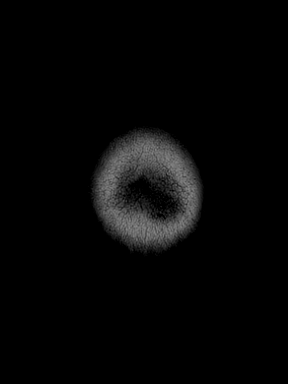
[im 24/47]
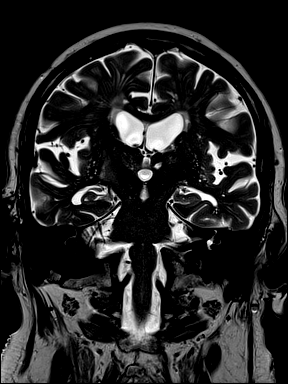
[im 47/47]
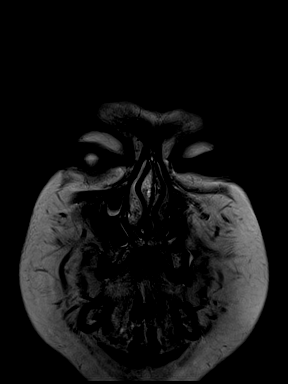

[Series 10: T1 post-contrast · axial · 1.0mm · 0.75mm/px · z∈[-108,+51]mm · 11 of 160 slices shown (1 of 2)]
[im 1/160]
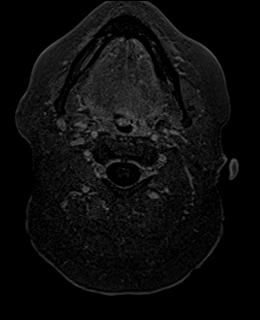
[im 16/160]
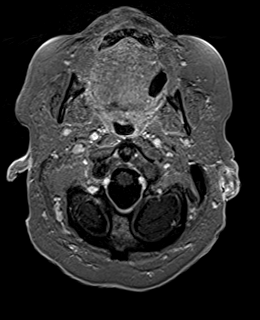
[im 32/160]
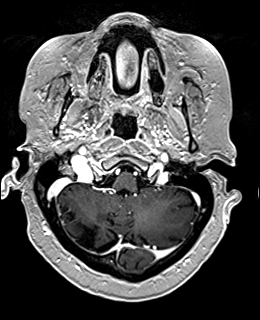
[im 48/160]
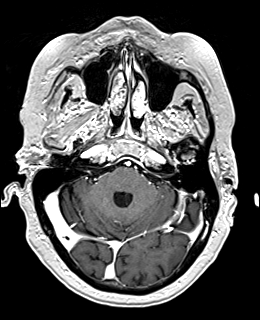
[im 64/160]
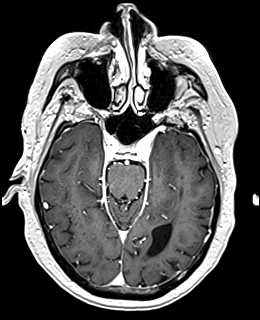
[im 80/160]
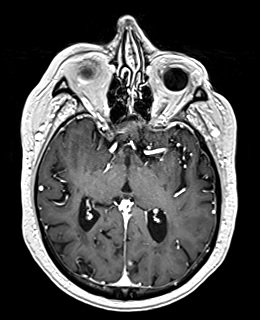
[im 96/160]
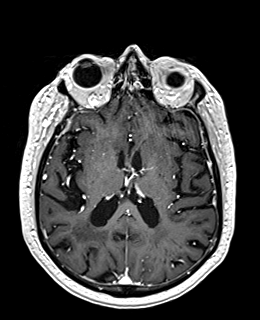
[im 112/160]
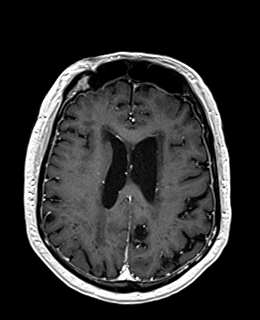
[im 128/160]
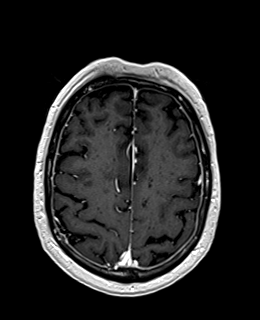
[im 144/160]
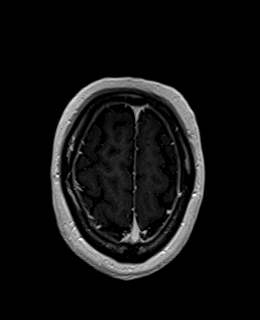
[im 160/160]
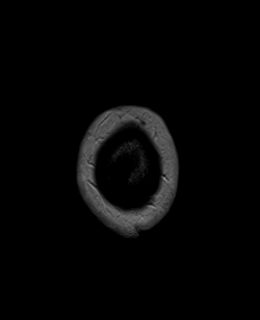

[Series 11: T1 post-contrast · coronal · 3.0mm · 0.57mm/px · 3 of 47 slices shown (2 of 2)]
[im 1/47]
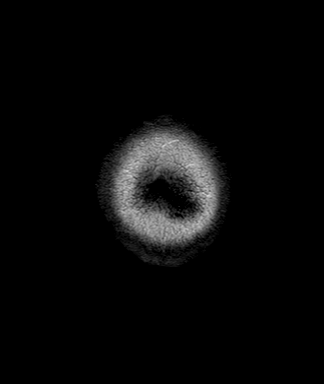
[im 24/47]
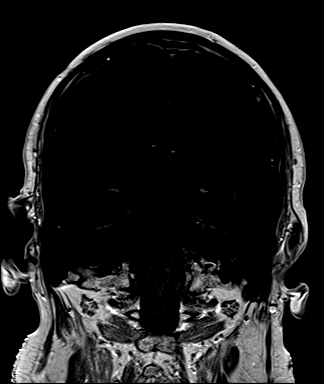
[im 47/47]
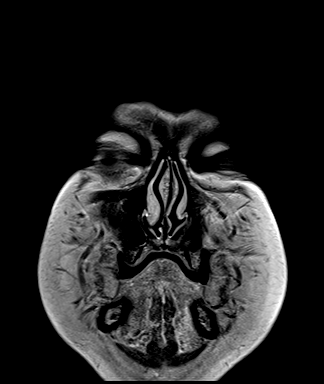

[Series 12: FLAIR post-contrast · sagittal · 3.0mm · 0.75mm/px · 3 of 39 slices shown]
[im 1/39]
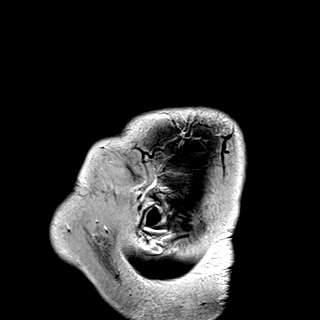
[im 20/39]
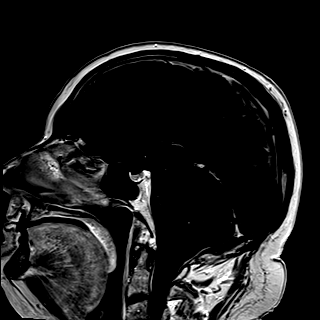
[im 39/39]
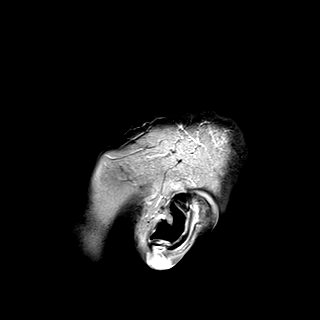

[48 of 48 positions shown; findings below may reference images not displayed]

FINDINGS: Brain: A peripherally enhancing lesion within the right basal
ganglia measures 14 x 13 x 12 mm.

The lesion anteriorly in the right temporal tip measures 11 x 7 x 6
mm.

A 3 mm lesion is present in the medial left temporal lobe, just
above the tentorium chronic on image 52 of series 10.

A 4 mm lesion is present in the medial left occipital lobe on image
80 of series 10.

A punctate focus of enhancement is present in the medial left
parietal lobe on image 111 of series 10.

A medial posterior left frontal lobe lesion measures 8 mm on image
126 of series 10.

A lesion within the right superior frontal gyrus measures 4.5 x
mm on image 136 of series 10.

Each of these lesions demonstrates susceptibility compatible with
prior hemorrhage. No other acute hemorrhage is present. T2 signal is
associated with each of these lesions.

Moderate generalized atrophy and white matter changes are present in
addition. The internal auditory canals are normal. Mild white matter
changes are present in the brainstem. A remote medial right PICA
territory infarct is evident.

Vascular: Flow is present in the major intracranial arteries.

Skull and upper cervical spine: The skullbase is within normal
limits. Midline sagittal structures are unremarkable.

Sinuses/Orbits: The paranasal sinuses and mastoid air cells are
clear. A left lens replacement is present. The globes and orbits are
otherwise unremarkable.
IMPRESSION: 1. The 5 hemorrhagic metastases seen on the prior study are again
noted.
2. 2 additional punctate metastases are identified, 1 in the medial
left temporal lobe and 1 in the medial left parietal lobe, as
described.
3. Moderate diffuse periventricular and subcortical white matter and
atrophy is present bilaterally. This likely reflects the sequela of
chronic microvascular ischemia.
